# Patient Record
Sex: Female | Born: 1985 | Smoking: Never smoker
Health system: Southern US, Community
[De-identification: ages and names within clinical notes are randomized; demographics above are authoritative.]

## PROBLEM LIST (undated history)

## (undated) DIAGNOSIS — N809 Endometriosis, unspecified: Secondary | ICD-10-CM

## (undated) DIAGNOSIS — K219 Gastro-esophageal reflux disease without esophagitis: Secondary | ICD-10-CM

## (undated) DIAGNOSIS — R51 Headache: Secondary | ICD-10-CM

## (undated) DIAGNOSIS — K567 Ileus, unspecified: Secondary | ICD-10-CM

## (undated) DIAGNOSIS — D649 Anemia, unspecified: Secondary | ICD-10-CM

## (undated) DIAGNOSIS — K9189 Other postprocedural complications and disorders of digestive system: Secondary | ICD-10-CM

## (undated) DIAGNOSIS — R519 Headache, unspecified: Secondary | ICD-10-CM

## (undated) HISTORY — PX: ABDOMINAL HYSTERECTOMY: SHX81

## (undated) HISTORY — PX: TONSILLECTOMY: SUR1361

## (undated) HISTORY — PX: OVARIAN CYST REMOVAL: SHX89

---

## 2007-07-11 HISTORY — PX: WISDOM TOOTH EXTRACTION: SHX21

## 2007-07-11 HISTORY — PX: COLONOSCOPY: SHX174

## 2012-07-10 HISTORY — PX: DIAGNOSTIC LAPAROSCOPY: SUR761

## 2013-07-10 HISTORY — PX: APPENDECTOMY: SHX54

## 2017-04-11 NOTE — H&P (Signed)
Patient ID: Rachel Stafford is a 31 y.o. female presenting with Pre Op Consulting  on 03/22/2017  HPI: Hx of pelvic pain with surgically dx endometriosis managed 3 yrs ago at an endometriosis center in [place], and Mirena IUD placed 09/2012. She's currently on continuous OCPs. She is due for IUD removal but her strings are not visible, and placement was so painful that she will need anesthesia for removal under ultrasound guidance.  Strings lost last year, and an u/s at [facility] confirmed intrauterine placement 04/2016.  FINDINGS:  Uterus/cervix:Uterus is anteverted and measures 6.4 x 5.3 x 2.6 cm (previously 7.2 x 2.4 x 4.7 cm).No focal myometrial masses are seen. IUD is seen within the endometrium in appropriate position. The IUD strings are located within the endometrial canal per the sonographer's report.   Right ovary: Measures 4.1 x 1.5 x 1.7 cm (previously 3.0 x 1.7 x 2.0 cm) and appears unremarkable. Normal vascularity.  Left ovary: Measures 4.0 x 3.2 x 2.9 cm (previously 2.3 x 1.6 x 2.8 cm). There is a left ovarian cyst measuring 3.0 x 2.8 x 2.3 cm. Normal vascularity.  Cul de sac: No free fluid.  Her pap is up to date.   She is not having pelvic pain that is bad and isn't interested in op lap at this time.  Past Medical History:  has a past medical history of Allergic state; Endometriosis of uterus; Fibroid; GERD (gastroesophageal reflux disease); Ovarian cyst; and Vaginismus.  Past Surgical History:  has a past surgical history that includes Appendectomy; Tonsillectomy; Removal Ovarian Cyst; and Pelvic laparoscopy. Family History: family history includes Allergic rhinitis in her father; Other in her maternal grandmother and mother; Prostate cancer in her paternal grandfather; Skin cancer in her maternal grandmother, mother, and paternal grandmother; Stroke in her paternal grandfather. Social History:  reports that she has never smoked. She has never used smokeless  tobacco. She reports that she drinks alcohol. She reports that she does not use drugs. OB/GYN History:          OB History    Gravida Para Term Preterm AB Living   0 0 0 0 0 0   SAB TAB Ectopic Molar Multiple Live Births   0 0 0 0 0 0      Allergies: is allergic to oxycodone-acetaminophen. Medications:  Current Outpatient Prescriptions:  .  cetirizine (ZYRTEC) 10 MG tablet, Take 10 mg by mouth., Disp: , Rfl:  .  ciclopirox (PENLAC) 8 % topical nail solution, Apply over nail and surrounding skin., Disp: 6.6 mL, Rfl: 11 .  clindamycin (CLEOCIN T) 1 % lotion, Apply 2x daily, Disp: 60 mL, Rfl: 2 .  levonorgestrel (MIRENA) 20 mcg/24 hr (5 years) IUD, Insert into the uterus., Disp: , Rfl:  .  naproxen (NAPROSYN) 500 MG tablet, Take 1 tablet (500 mg total) by mouth 2 (two) times daily with meals., Disp: 20 tablet, Rfl: 1 .  norethindrone-ethinyl estradiol-iron (LOESTRIN 24 FE) 1 mg-20 mcg (24)/75 mg (4) tablet, Take 1 tablet by mouth once daily., Disp: 3 Package, Rfl: 4   Review of Systems: No SOB, no palpitations or chest pain, no new lower extremity edema, no nausea or vomiting or bowel or bladder complaints. See HPI for gyn specific ROS.   Exam:   BP 122/79   Pulse 64   Wt 62.6 kg (138 lb)   BMI 23.69 kg/m   General: Patient is well-groomed, well-nourished, appears stated age in no acute distress  HEENT: head is atraumatic and normocephalic, trachea  is midline, neck is supple with no palpable nodules  CV: Regular rhythm and normal heart rate, no murmur  Pulm: Clear to auscultation throughout lung fields with no wheezing, crackles, or rhonchi. No increased work of breathing  Abdomen: soft , no mass, non-tender, no rebound tenderness, no hepatomegaly  Pelvic: deferred  Impression:   The primary encounter diagnosis was Pelvic pain in female. A diagnosis of Intrauterine contraceptive device threads lost, subsequent encounter was also pertinent to this  visit.    Plan:   IUD removal in the OR under u/s guidance. Hysteroscopy is not expected.  We discussed dx lap but she is not interested at this time.   Consents signed today. Risks of surgery were discussed with the patient including but not limited to: bleeding which may require transfusion; infection which may require antibiotics; injury to uterus or surrounding organs; intrauterine scarring which may impair future fertility; need for additional procedures including laparotomy or laparoscopy; and other postoperative/anesthesia complications. Written informed consent was obtained.

## 2017-04-13 ENCOUNTER — Encounter: Payer: Self-pay | Admitting: *Deleted

## 2017-04-13 ENCOUNTER — Encounter
Admission: RE | Admit: 2017-04-13 | Discharge: 2017-04-13 | Disposition: A | Discharge status: Home / Self Care | Payer: Managed Care, Other (non HMO) | Source: Ambulatory Visit | Attending: Obstetrics and Gynecology | Admitting: Obstetrics and Gynecology

## 2017-04-13 NOTE — Patient Instructions (Signed)
  Your procedure is scheduled on: 04-20-17 FRIDAY Report to Same Day Surgery 2nd floor medical mall [facility] Entrance-take elevator on left to 2nd floor.  Check in with surgery information desk.) To find out your arrival time please call [phone] between 1PM - 3PM on 04-19-17 THURSDAY  Remember: Instructions that are not followed completely may result in serious medical risk, up to and including death, or upon the discretion of your surgeon and anesthesiologist your surgery may need to be rescheduled.    _x___ 1. Do not eat food after midnight the night before your procedure. NO GUM CHEWING OR HARD CANDIES.  You may drink clear liquids up to 2 hours before you are scheduled to arrive at the hospital for your procedure.  Do not drink clear liquids within 2 hours of your scheduled arrival to the hospital.  Clear liquids include  --Water or Apple juice without pulp  --Clear carbohydrate beverage such as ClearFast or Gatorade  --Black Coffee or Clear Tea (No milk, no creamers, do not add anything to the coffee or Tea)  Type 1 and type 2 diabetics should only drink water.     __x__ 2. No Alcohol for 24 hours before or after surgery.   __x__3. No Smoking for 24 prior to surgery.   ____  4. Bring all medications with you on the day of surgery if instructed.    __x__ 5. Notify your doctor if there is any change in your medical condition     (cold, fever, infections).     Do not wear jewelry, make-up, hairpins, clips or nail polish.  Do not wear lotions, powders, or perfumes. You may wear deodorant.  Do not shave 48 hours prior to surgery. Men may shave face and neck.  Do not bring valuables to the hospital.    [facility] is not responsible for any belongings or valuables.               Contacts, dentures or bridgework may not be worn into surgery.  Leave your suitcase in the car. After surgery it may be brought to your room.  For patients admitted to the hospital, discharge time  is determined by your treatment team.   Patients discharged the day of surgery will not be allowed to drive home.  You will need someone to drive you home and stay with you the night of your procedure.    Please read over the following fact sheets that you were given:    ____ TAKE THE FOLLOWING MEDICATIONS THE MORNING OF SURGERY WITH A SMALL SIP OF WATER. These include:  1. NONE  2.  3.  4.  5.  6.  ____Fleets enema or Magnesium Citrate as directed.   ____ Use CHG Soap or sage wipes as directed on instruction sheet   ____ Use inhalers on the day of surgery and bring to hospital day of surgery  ____ Stop Metformin and Janumet 2 days prior to surgery.    ____ Take 1/2 of usual insulin dose the night before surgery and none on the morning surgery.   ____ Follow recommendations from Cardiologist, Pulmonologist or PCP regarding stopping Aspirin, Coumadin, Plavix ,Eliquis, Effient, or Pradaxa, and Pletal.  X____Stop Anti-inflammatories such as Advil, ALEVE, Ibuprofen, Motrin, Naproxen, Naprosyn, Goodies powders or aspirin products NOW-OK to take Tylenol .   ____ Stop supplements until after surgery.   ____ Bring C-Pap to the hospital.

## 2017-04-17 ENCOUNTER — Encounter
Admission: RE | Admit: 2017-04-17 | Discharge: 2017-04-17 | Disposition: A | Discharge status: Home / Self Care | Payer: Managed Care, Other (non HMO) | Source: Ambulatory Visit | Attending: Obstetrics and Gynecology | Admitting: Obstetrics and Gynecology

## 2017-04-17 DIAGNOSIS — Z8739 Personal history of other diseases of the musculoskeletal system and connective tissue: Secondary | ICD-10-CM | POA: Diagnosis not present

## 2017-04-17 DIAGNOSIS — Z791 Long term (current) use of non-steroidal anti-inflammatories (NSAID): Secondary | ICD-10-CM | POA: Diagnosis not present

## 2017-04-17 DIAGNOSIS — Y838 Other surgical procedures as the cause of abnormal reaction of the patient, or of later complication, without mention of misadventure at the time of the procedure: Secondary | ICD-10-CM | POA: Diagnosis not present

## 2017-04-17 DIAGNOSIS — Z793 Long term (current) use of hormonal contraceptives: Secondary | ICD-10-CM | POA: Diagnosis not present

## 2017-04-17 DIAGNOSIS — T8332XA Displacement of intrauterine contraceptive device, initial encounter: Secondary | ICD-10-CM | POA: Diagnosis present

## 2017-04-17 DIAGNOSIS — Z79899 Other long term (current) drug therapy: Secondary | ICD-10-CM | POA: Diagnosis not present

## 2017-04-17 LAB — CBC
HCT: 41.7 % (ref 35.0–47.0)
Hemoglobin: 14.3 g/dL (ref 12.0–16.0)
MCH: 30.7 pg (ref 26.0–34.0)
MCHC: 34.2 g/dL (ref 32.0–36.0)
MCV: 89.6 fL (ref 80.0–100.0)
PLATELETS: 241 10*3/uL (ref 150–440)
RBC: 4.65 MIL/uL (ref 3.80–5.20)
RDW: 13.4 % (ref 11.5–14.5)
WBC: 5.1 10*3/uL (ref 3.6–11.0)

## 2017-04-17 LAB — TYPE AND SCREEN
ABO/RH(D): AB NEG
ANTIBODY SCREEN: NEGATIVE

## 2017-04-17 LAB — BASIC METABOLIC PANEL
Anion gap: 6 (ref 5–15)
BUN: 14 mg/dL (ref 6–20)
CHLORIDE: 105 mmol/L (ref 101–111)
CO2: 27 mmol/L (ref 22–32)
CREATININE: 0.84 mg/dL (ref 0.44–1.00)
Calcium: 9.1 mg/dL (ref 8.9–10.3)
Glucose, Bld: 96 mg/dL (ref 65–99)
Potassium: 3.9 mmol/L (ref 3.5–5.1)
SODIUM: 138 mmol/L (ref 135–145)

## 2017-04-20 ENCOUNTER — Encounter
Admission: RE | Disposition: A | Discharge status: Home / Self Care | Payer: Self-pay | Source: Ambulatory Visit | Attending: Obstetrics and Gynecology

## 2017-04-20 ENCOUNTER — Ambulatory Visit
Admission: RE | Admit: 2017-04-20 | Discharge: 2017-04-20 | Disposition: A | Discharge status: Home / Self Care | Payer: Managed Care, Other (non HMO) | Source: Ambulatory Visit | Attending: Obstetrics and Gynecology | Admitting: Obstetrics and Gynecology

## 2017-04-20 ENCOUNTER — Ambulatory Visit: Payer: Managed Care, Other (non HMO) | Admitting: Registered Nurse

## 2017-04-20 DIAGNOSIS — Z793 Long term (current) use of hormonal contraceptives: Secondary | ICD-10-CM | POA: Insufficient documentation

## 2017-04-20 DIAGNOSIS — T8332XA Displacement of intrauterine contraceptive device, initial encounter: Secondary | ICD-10-CM | POA: Insufficient documentation

## 2017-04-20 DIAGNOSIS — Z79899 Other long term (current) drug therapy: Secondary | ICD-10-CM | POA: Insufficient documentation

## 2017-04-20 DIAGNOSIS — Z791 Long term (current) use of non-steroidal anti-inflammatories (NSAID): Secondary | ICD-10-CM | POA: Insufficient documentation

## 2017-04-20 DIAGNOSIS — Z8739 Personal history of other diseases of the musculoskeletal system and connective tissue: Secondary | ICD-10-CM | POA: Insufficient documentation

## 2017-04-20 DIAGNOSIS — Y838 Other surgical procedures as the cause of abnormal reaction of the patient, or of later complication, without mention of misadventure at the time of the procedure: Secondary | ICD-10-CM | POA: Insufficient documentation

## 2017-04-20 HISTORY — DX: Ileus, unspecified: K56.7

## 2017-04-20 HISTORY — DX: Anemia, unspecified: D64.9

## 2017-04-20 HISTORY — DX: Headache: R51

## 2017-04-20 HISTORY — DX: Endometriosis, unspecified: N80.9

## 2017-04-20 HISTORY — PX: IUD REMOVAL: SHX5392

## 2017-04-20 HISTORY — DX: Gastro-esophageal reflux disease without esophagitis: K21.9

## 2017-04-20 HISTORY — DX: Headache, unspecified: R51.9

## 2017-04-20 HISTORY — DX: Other postprocedural complications and disorders of digestive system: K91.89

## 2017-04-20 LAB — ABO/RH: ABO/RH(D): AB NEG

## 2017-04-20 LAB — POCT PREGNANCY, URINE: Preg Test, Ur: NEGATIVE

## 2017-04-20 SURGERY — REMOVAL, INTRAUTERINE DEVICE
Anesthesia: General

## 2017-04-20 MED ORDER — GABAPENTIN 400 MG PO CAPS
800.0000 mg | ORAL_CAPSULE | ORAL | Status: AC
  Administered 2017-04-20: 800 mg via ORAL

## 2017-04-20 MED ORDER — MIDAZOLAM HCL 2 MG/2ML IJ SOLN
INTRAMUSCULAR | Status: DC | PRN
  Administered 2017-04-20: 2 mg via INTRAVENOUS

## 2017-04-20 MED ORDER — DEXAMETHASONE SODIUM PHOSPHATE 10 MG/ML IJ SOLN
INTRAMUSCULAR | Status: DC | PRN
  Administered 2017-04-20: 10 mg via INTRAVENOUS

## 2017-04-20 MED ORDER — GABAPENTIN 300 MG PO CAPS: 900.0000 mg | ORAL_CAPSULE | ORAL | Status: DC

## 2017-04-20 MED ORDER — SILVER NITRATE-POT NITRATE 75-25 % EX MISC
CUTANEOUS | Status: DC | PRN
  Administered 2017-04-20: 6

## 2017-04-20 MED ORDER — ACETAMINOPHEN 500 MG PO TABS
1000.0000 mg | ORAL_TABLET | ORAL | Status: AC
  Administered 2017-04-20: 1000 mg via ORAL

## 2017-04-20 MED ORDER — MIDAZOLAM HCL 2 MG/2ML IJ SOLN
INTRAMUSCULAR | Status: AC
  Filled 2017-04-20: qty 2

## 2017-04-20 MED ORDER — BUPIVACAINE LIPOSOME 1.3 % IJ SUSP
INTRAMUSCULAR | Status: DC | PRN
  Administered 2017-04-20: 20 mL

## 2017-04-20 MED ORDER — IBUPROFEN 800 MG PO TABS: 800.0000 mg | ORAL_TABLET | Freq: Three times a day (TID) | ORAL | 1 refills | Status: AC | PRN

## 2017-04-20 MED ORDER — FAMOTIDINE 20 MG PO TABS
ORAL_TABLET | ORAL | Status: AC
  Filled 2017-04-20: qty 1

## 2017-04-20 MED ORDER — LACTATED RINGERS IV SOLN
INTRAVENOUS | Status: DC
  Administered 2017-04-20: 12:00:00 via INTRAVENOUS

## 2017-04-20 MED ORDER — FENTANYL CITRATE (PF) 100 MCG/2ML IJ SOLN
INTRAMUSCULAR | Status: DC | PRN
  Administered 2017-04-20 (×2): 25 ug via INTRAVENOUS
  Administered 2017-04-20: 50 ug via INTRAVENOUS
  Administered 2017-04-20: 25 ug via INTRAVENOUS

## 2017-04-20 MED ORDER — ONDANSETRON HCL 4 MG/2ML IJ SOLN
INTRAMUSCULAR | Status: AC
  Filled 2017-04-20: qty 2

## 2017-04-20 MED ORDER — LIDOCAINE HCL (PF) 2 % IJ SOLN
INTRAMUSCULAR | Status: AC
  Filled 2017-04-20: qty 4

## 2017-04-20 MED ORDER — GLYCOPYRROLATE 0.2 MG/ML IJ SOLN
INTRAMUSCULAR | Status: AC
  Filled 2017-04-20: qty 1

## 2017-04-20 MED ORDER — PROPOFOL 10 MG/ML IV BOLUS
INTRAVENOUS | Status: DC | PRN
  Administered 2017-04-20: 150 mg via INTRAVENOUS

## 2017-04-20 MED ORDER — BUPIVACAINE LIPOSOME 1.3 % IJ SUSP
INTRAMUSCULAR | Status: AC
  Filled 2017-04-20: qty 20

## 2017-04-20 MED ORDER — LIDOCAINE HCL (CARDIAC) 20 MG/ML IV SOLN
INTRAVENOUS | Status: DC | PRN
  Administered 2017-04-20: 80 mg via INTRAVENOUS

## 2017-04-20 MED ORDER — GABAPENTIN 800 MG PO TABS: 800.0000 mg | ORAL_TABLET | Freq: Every day | ORAL | 0 refills | Status: DC

## 2017-04-20 MED ORDER — PHENYLEPHRINE HCL 10 MG/ML IJ SOLN
INTRAMUSCULAR | Status: DC | PRN
  Administered 2017-04-20 (×3): 100 ug via INTRAVENOUS

## 2017-04-20 MED ORDER — FAMOTIDINE 20 MG PO TABS
20.0000 mg | ORAL_TABLET | Freq: Once | ORAL | Status: AC
  Administered 2017-04-20: 20 mg via ORAL

## 2017-04-20 MED ORDER — EPHEDRINE SULFATE 50 MG/ML IJ SOLN
INTRAMUSCULAR | Status: DC | PRN
  Administered 2017-04-20: 10 mg via INTRAVENOUS

## 2017-04-20 MED ORDER — GABAPENTIN 400 MG PO CAPS
ORAL_CAPSULE | ORAL | Status: AC
  Administered 2017-04-20: 800 mg via ORAL
  Filled 2017-04-20: qty 1

## 2017-04-20 MED ORDER — FENTANYL CITRATE (PF) 100 MCG/2ML IJ SOLN
INTRAMUSCULAR | Status: AC
  Filled 2017-04-20: qty 2

## 2017-04-20 MED ORDER — GLYCOPYRROLATE 0.2 MG/ML IJ SOLN
INTRAMUSCULAR | Status: DC | PRN
  Administered 2017-04-20: 0.2 mg via INTRAVENOUS

## 2017-04-20 MED ORDER — KETOROLAC TROMETHAMINE 30 MG/ML IJ SOLN
INTRAMUSCULAR | Status: AC
  Filled 2017-04-20: qty 1

## 2017-04-20 MED ORDER — KETOROLAC TROMETHAMINE 30 MG/ML IJ SOLN
INTRAMUSCULAR | Status: DC | PRN
  Administered 2017-04-20: 30 mg via INTRAVENOUS

## 2017-04-20 MED ORDER — DEXAMETHASONE SODIUM PHOSPHATE 10 MG/ML IJ SOLN
INTRAMUSCULAR | Status: AC
  Filled 2017-04-20: qty 1

## 2017-04-20 MED ORDER — ACETAMINOPHEN 500 MG PO TABS: 1000.0000 mg | ORAL_TABLET | Freq: Four times a day (QID) | ORAL | 0 refills | Status: AC

## 2017-04-20 MED ORDER — ACETAMINOPHEN 500 MG PO TABS
ORAL_TABLET | ORAL | Status: AC
  Administered 2017-04-20: 1000 mg via ORAL
  Filled 2017-04-20: qty 2

## 2017-04-20 SURGICAL SUPPLY — 9 items
CATH ROBINSON RED A/P 16FR (CATHETERS) ×2 IMPLANT
GLOVE BIO SURGEON STRL SZ7 (GLOVE) ×6 IMPLANT
GLOVE INDICATOR 7.5 STRL GRN (GLOVE) ×6 IMPLANT
GOWN STRL REUS W/ TWL LRG LVL3 (GOWN DISPOSABLE) ×2 IMPLANT
GOWN STRL REUS W/TWL LRG LVL3 (GOWN DISPOSABLE) ×2
KIT RM TURNOVER CYSTO AR (KITS) ×2 IMPLANT
PACK DNC HYST (MISCELLANEOUS) ×2 IMPLANT
PAD PREP 24X41 OB/GYN DISP (PERSONAL CARE ITEMS) ×2 IMPLANT
TOWEL OR 17X26 4PK STRL BLUE (TOWEL DISPOSABLE) ×2 IMPLANT

## 2017-04-20 NOTE — Anesthesia Postprocedure Evaluation (Signed)
Anesthesia Post Note  Patient: Rachel Stafford  Procedure(s) Performed: INTRAUTERINE DEVICE (IUD) REMOVAL (N/A )  Patient location during evaluation: PACU Anesthesia Type: General Level of consciousness: awake and alert Pain management: pain level controlled Vital Signs Assessment: post-procedure vital signs reviewed and stable Respiratory status: spontaneous breathing, nonlabored ventilation, respiratory function stable and patient connected to nasal cannula oxygen Cardiovascular status: blood pressure returned to baseline and stable Postop Assessment: no apparent nausea or vomiting Anesthetic complications: no     Last Vitals:  Vitals:   04/20/17 1413 04/20/17 1434  BP: 122/87 (!) 124/95  Pulse: 80 84  Resp: 16   Temp: (!) 35.9 C   SpO2: 100% 100%    Last Pain:  Vitals:   04/20/17 1434  TempSrc:   PainSc: 2                  Anab Vivar S

## 2017-04-20 NOTE — Discharge Instructions (Signed)
Discharge instructions after uterine surgery  Signs and Symptoms to Report  Call our office at [phone] if you have any of the following:    Fever over 100.4 degrees or higher  Severe stomach pain not relieved with pain medications  Bright red bleeding thats heavier than a period that does not slow with rest after the first 24 hours  To go the bathroom a lot (frequency), you cant hold your urine (urgency), or it hurts when you empty your bladder (urinate)  Chest pain  Shortness of breath  Pain in the calves of your legs  Severe nausea and vomiting not relieved with anti-nausea medications  Any concerns  What You Can Expect after Surgery  You may see some pink tinged, bloody fluid. This is normal. You may also have cramping for several days.   Activities after Your Discharge Follow these guidelines to help speed your recovery at home:  Dont drive if you are in pain or taking narcotic pain medicine. You may drive when you can safely slam on the brakes, turn the wheel forcefully, and rotate your torso comfortably. This is typically 4-7 days. Practice in a parking lot or side street prior to attempting to drive regularly.   Ask others to help with household chores for 4 weeks.  Dont do strenuous activities, exercises, or sports like vacuuming, tennis, squash, etc. until your doctor says it is safe to do so.  Walk as you feel able. Rest often since it may take a week or two for your energy level to return to normal.   You may climb stairs  Avoid constipation:   -Eat fruits, vegetables, and whole grains. Eat small meals as your appetite will take time to return to normal.   -Drink 6 to 8 glasses of water each day unless your doctor has told you to limit your fluids.   -Use a laxative or stool softener as needed if constipation becomes a problem. You may take Miralax, metamucil, Citrucil, Colace, Senekot, FiberCon, etc. If this does not relieve the constipation, try  two tablespoons of Milk Of Magnesia every 8 hours until your bowels move.   You may shower.   Do not get in a hot tub, swimming pool, etc. until your doctor agrees.  Do not douche, use tampons, or have sex until your doctor says it is okay, usually about 2 weeks.  Take your pain medicine when you need it. The medicine may not work as well if the pain is bad.  Take the medicines you were taking before surgery. Other medications you might need are pain medications (ibuprofen, tylenol, gabapentin).

## 2017-04-20 NOTE — Interval H&P Note (Signed)
History and Physical Interval Note:  04/20/2017 11:44 AM  Rachel Stafford  has presented today for surgery, with the diagnosis of Lost IUD Strings  The various methods of treatment have been discussed with the patient and family. After consideration of risks, benefits and other options for treatment, the patient has consented to  Procedure(s): INTRAUTERINE DEVICE (IUD) REMOVAL (N/A) and chronic pelvic pain as a surgical intervention .  The patient's history has been reviewed, patient examined, no change in status, stable for surgery.  I have reviewed the patient's chart and labs.  Questions were answered to the patient's satisfaction.     [name]

## 2017-04-20 NOTE — Anesthesia Procedure Notes (Signed)
Procedure Name: LMA Insertion Date/Time: 04/20/2017 12:11 PM Performed by: Stormy Fabian Pre-anesthesia Checklist: Patient identified, Patient being monitored, Timeout performed, Emergency Drugs available and Suction available Patient Re-evaluated:Patient Re-evaluated prior to induction Oxygen Delivery Method: Circle system utilized Preoxygenation: Pre-oxygenation with 100% oxygen Induction Type: IV induction Ventilation: Mask ventilation without difficulty LMA: LMA inserted LMA Size: 3.5 Tube type: Oral Number of attempts: 1 Placement Confirmation: positive ETCO2 and breath sounds checked- equal and bilateral Tube secured with: Tape Dental Injury: Teeth and Oropharynx as per pre-operative assessment

## 2017-04-20 NOTE — Transfer of Care (Signed)
Immediate Anesthesia Transfer of Care Note  Patient: Rachel Stafford  Procedure(s) Performed: Procedure(s): INTRAUTERINE DEVICE (IUD) REMOVAL (N/A)  Patient Location: PACU  Anesthesia Type:General  Level of Consciousness: sedated  Airway & Oxygen Therapy: Patient Spontanous Breathing and Patient connected to face mask oxygen  Post-op Assessment: Report given to RN and Post -op Vital signs reviewed and stable  Post vital signs: Reviewed and stable  Last Vitals:  Vitals:   04/20/17 1124 04/20/17 1317  BP: 126/78 121/86  Pulse: 64 (!) 106  Resp: 20 16  Temp: 36.5 C 36.4 C  SpO2: 100% 100%    Complications: No apparent anesthesia complications

## 2017-04-20 NOTE — Op Note (Signed)
PROCEDURE DATE: 07/16/2014  PREOPERATIVE DIAGNOSES: Lost IUD strings, hx of chronic debilitating pelvic pain POSTOPERATIVE DIAGNOSES: The same PROCEDURE:  IUD Removal under anesthesia, ultrasound guidance SURGEON: Dr. [name] ANESTHESIOLOGIST: Berdine Addison, MD Anesthesiologist: Berdine Addison, MD CRNA: Stormy Fabian, CRNA   INDICATIONS: 31 y.o. F here for IUD removal under anesthesia secondary to lost IUD strings and significant pelvic pain. The risks of surgery were discussed in detail with the patient including but not limited to: bleeding; infection which may require antibiotics; injury to uterus or surrounding organs which may involve bowel, bladder, ureters ; need for additional procedures including laparoscopy or laparotomy; thromboembolic phenomenon, surgical site problems and other postoperative/anesthesia complications. Written informed consent was obtained.   FINDINGS: Small uterus, nulliparous cervix. IUD strings not visualized but ultrasound clearly confirmed fundal placement.  ANESTHESIA: LMA, local using 20 ml of 0.5% Exparel ESTIMATED BLOOD LOSS: 5 ml COMPLICATIONS: None immediate  PROCEDURE IN DETAIL: The patient was then taken to the operating room where LMA was administered and was found to be adequate. She was placed in the dorsal lithotomy position, and was prepped and draped in a sterile manner. After an adequate timeout was performed, attention was turned to her pelvis where a speculum was placed in the vagina.  The cervix was visualized and grasped anteriorly with a single tooth tenaculum. Ultrasound was used to confirm IUD placement.   Dilation was not required. An IUD hook was introduced into the endometrial cavity.  After a few attempts, the IUD was grasped and removed in its entirety.  20cc of Exparel was used to place a cervical block.   All instruments were removed from the patient's vagina. Silver nitrate and pressure used to assure hemostasis.  Sponge and instrument counts were correct.  The patient tolerated the procedure well and was taken to the recovery area awake, and in stable condition.   The patient will be discharged to home as per PACU criteria.  She was prescribed Ibuprofen, Tylenol, Gabapentin for pain. Routine postoperative instructions given.  She will follow up in clinic as needed.She will use OCPs for contraception.

## 2017-04-20 NOTE — Anesthesia Preprocedure Evaluation (Signed)
Anesthesia Evaluation  Patient identified by MRN, date of birth, ID band Patient awake    Reviewed: Allergy & Precautions, NPO status , Patient's Chart, lab work & pertinent test results  History of Anesthesia Complications Negative for: history of anesthetic complications  Airway Mallampati: II       Dental   Pulmonary neg sleep apnea, neg COPD,           Cardiovascular (-) hypertension(-) Past MI and (-) CHF (-) dysrhythmias (-) Valvular Problems/Murmurs     Neuro/Psych neg Seizures    GI/Hepatic Neg liver ROS, GERD  ,  Endo/Other  neg diabetes  Renal/GU negative Renal ROS     Musculoskeletal   Abdominal   Peds  Hematology   Anesthesia Other Findings   Reproductive/Obstetrics                             Anesthesia Physical Anesthesia Plan  ASA: II  Anesthesia Plan: General   Post-op Pain Management:    Induction: Intravenous  PONV Risk Score and Plan: 3 and Ondansetron, Dexamethasone, Midazolam and Treatment may vary due to age or medical condition  Airway Management Planned: Oral ETT  Additional Equipment:   Intra-op Plan:   Post-operative Plan:   Informed Consent: I have reviewed the patients History and Physical, chart, labs and discussed the procedure including the risks, benefits and alternatives for the proposed anesthesia with the patient or authorized representative who has indicated his/her understanding and acceptance.     Plan Discussed with:   Anesthesia Plan Comments:         Anesthesia Quick Evaluation

## 2017-04-20 NOTE — Anesthesia Post-op Follow-up Note (Signed)
Anesthesia QCDR form completed.        

## 2017-04-23 ENCOUNTER — Encounter: Payer: Self-pay | Admitting: Obstetrics and Gynecology

## 2017-04-29 ENCOUNTER — Encounter: Payer: Self-pay | Admitting: Obstetrics and Gynecology

## 2018-01-20 ENCOUNTER — Ambulatory Visit
Admission: EM | Admit: 2018-01-20 | Discharge: 2018-01-20 | Disposition: A | Discharge status: Home / Self Care | Payer: Managed Care, Other (non HMO) | Attending: Emergency Medicine | Admitting: Emergency Medicine

## 2018-01-20 DIAGNOSIS — N938 Other specified abnormal uterine and vaginal bleeding: Secondary | ICD-10-CM | POA: Diagnosis not present

## 2018-01-20 DIAGNOSIS — K219 Gastro-esophageal reflux disease without esophagitis: Secondary | ICD-10-CM | POA: Diagnosis not present

## 2018-01-20 DIAGNOSIS — Z3202 Encounter for pregnancy test, result negative: Secondary | ICD-10-CM | POA: Diagnosis not present

## 2018-01-20 DIAGNOSIS — R42 Dizziness and giddiness: Secondary | ICD-10-CM

## 2018-01-20 DIAGNOSIS — R110 Nausea: Secondary | ICD-10-CM | POA: Diagnosis not present

## 2018-01-20 LAB — CBC WITH DIFFERENTIAL/PLATELET
BASOS ABS: 0 10*3/uL (ref 0–0.1)
Basophils Relative: 1 %
Eosinophils Absolute: 0 10*3/uL (ref 0–0.7)
Eosinophils Relative: 2 %
HEMATOCRIT: 44.2 % (ref 35.0–47.0)
HEMOGLOBIN: 14.9 g/dL (ref 12.0–16.0)
Lymphocytes Relative: 31 %
Lymphs Abs: 1 10*3/uL (ref 1.0–3.6)
MCH: 30.4 pg (ref 26.0–34.0)
MCHC: 33.8 g/dL (ref 32.0–36.0)
MCV: 89.8 fL (ref 80.0–100.0)
MONO ABS: 0.3 10*3/uL (ref 0.2–0.9)
MONOS PCT: 11 %
Neutro Abs: 1.8 10*3/uL (ref 1.4–6.5)
Neutrophils Relative %: 55 %
Platelets: 204 10*3/uL (ref 150–440)
RBC: 4.92 MIL/uL (ref 3.80–5.20)
RDW: 13.1 % (ref 11.5–14.5)
WBC: 3.2 10*3/uL — ABNORMAL LOW (ref 3.6–11.0)

## 2018-01-20 LAB — BASIC METABOLIC PANEL
Anion gap: 11 (ref 5–15)
BUN: 10 mg/dL (ref 6–20)
CALCIUM: 8.4 mg/dL — AB (ref 8.9–10.3)
CO2: 24 mmol/L (ref 22–32)
CREATININE: 0.78 mg/dL (ref 0.44–1.00)
Chloride: 102 mmol/L (ref 98–111)
GFR calc Af Amer: >60 mL/min (ref >60)
GFR calc non Af Amer: >60 mL/min (ref >60)
GLUCOSE: 96 mg/dL (ref 70–99)
Potassium: 3.8 mmol/L (ref 3.5–5.1)
Sodium: 137 mmol/L (ref 135–145)

## 2018-01-20 LAB — URINALYSIS, COMPLETE (UACMP) WITH MICROSCOPIC
Bacteria, UA: NONE SEEN
Bilirubin Urine: NEGATIVE
Glucose, UA: NEGATIVE mg/dL
Ketones, ur: NEGATIVE mg/dL
LEUKOCYTES UA: NEGATIVE
NITRITE: NEGATIVE
PROTEIN: NEGATIVE mg/dL
SQUAMOUS EPITHELIAL / LPF: NONE SEEN (ref 0–5)
Specific Gravity, Urine: 1.01 (ref 1.005–1.030)
WBC UA: NONE SEEN WBC/hpf (ref 0–5)
pH: 6.5 (ref 5.0–8.0)

## 2018-01-20 LAB — PREGNANCY, URINE: Preg Test, Ur: NEGATIVE

## 2018-01-20 MED ORDER — PANTOPRAZOLE SODIUM 20 MG PO TBEC: 20.0000 mg | DELAYED_RELEASE_TABLET | Freq: Every day | ORAL | 1 refills | Status: DC

## 2018-01-20 NOTE — ED Provider Notes (Signed)
[facility]    CSN: [id number] Arrival date & time: 01/20/18  1215     History   Chief Complaint Chief Complaint  Patient presents with  . Nausea    HPI Rachel Stafford is a 32 y.o. female.   HPI  32 year old female presents with nausea and dizziness that she is had for a month or 2.  No syncope or near near syncope.  Has a history of endometriosis has recently been having periods every 10days.  States that she has fatigue and gets exhausted with only short distance walking.  Her lightheadedness occurs with positional changes and she feels very fatigued following that.  She and her husband are both starting a new job starting tomorrow.  She has had 3 periods this month are the last one started yesterday.  Last 4 to 5 days and has a normal flow for her.  Is going to schedule appointment with an OB/GYN for follow-up.     Past Medical History:  Diagnosis Date  . Anemia   . Endometriosis   . GERD (gastroesophageal reflux disease)    OTC CHEWABLES (LIKE TUMS PER PT)  . Headache   . Ileus, postoperative (HCC)     There are no active problems to display for this patient.   Past Surgical History:  Procedure Laterality Date  . APPENDECTOMY  2015  . COLONOSCOPY  2009  . DIAGNOSTIC LAPAROSCOPY  2014  . IUD REMOVAL N/A 04/20/2017   Procedure: INTRAUTERINE DEVICE (IUD) REMOVAL;  Surgeon: [name], MD;  Location: [facility] ORS;  Service: Gynecology;  Laterality: N/A;  . OVARIAN CYST REMOVAL    . TONSILLECTOMY     as a child  . WISDOM TOOTH EXTRACTION  2009    OB History   None      Home Medications    Prior to Admission medications   Medication Sig Start Date End Date Taking? Authorizing Provider  cetirizine (ZYRTEC) 10 MG tablet Take 10 mg by mouth daily.   Yes [provider]  norethindrone-ethinyl estradiol (JUNEL FE,GILDESS FE,LOESTRIN FE) 1-20 MG-MCG tablet Take 1 tablet by mouth daily.   Yes [provider]  ciclopirox  (PENLAC) 8 % solution Apply 1 application topically daily. 03/08/17   [provider]  Cyanocobalamin (B-12 PO) Take 2 Doses by mouth daily. gummies    [provider]  cyclobenzaprine (FLEXERIL) 5 MG tablet Take 5 mg by mouth daily as needed for muscle spasms.    [provider]  gabapentin (NEURONTIN) 800 MG tablet Take 1 tablet (800 mg total) by mouth at bedtime. 04/20/17 04/23/17  [name], MD  ibuprofen (ADVIL,MOTRIN) 800 MG tablet Take 1 tablet (800 mg total) by mouth every 8 (eight) hours as needed for moderate pain. 04/20/17   [name], MD  pantoprazole (PROTONIX) 20 MG tablet Take 1 tablet (20 mg total) by mouth daily. 01/20/18   Lutricia Feil, PA-C  rizatriptan (MAXALT) 5 MG tablet Take 5 mg by mouth daily as needed for migraine. 10/07/13   [provider]    Family History No family history on file.  Social History Social History   Tobacco Use  . Smoking status: Never Smoker  . Smokeless tobacco: Never Used  Substance Use Topics  . Alcohol use: Yes    Comment: 2-3 beer or wine weekly  . Drug use: No     Allergies   Ondansetron and Percocet [oxycodone-acetaminophen]   Review of Systems Review of Systems  Constitutional: Positive for activity  change and fatigue. Negative for chills and fever.  Genitourinary: Positive for menstrual problem.  Neurological: Positive for light-headedness.  All other systems reviewed and are negative.    Physical Exam Triage Vital Signs ED Triage Vitals  Enc Vitals Group     BP 01/20/18 1250 100/83     Pulse Rate 01/20/18 1250 85     Resp 01/20/18 1250 18     Temp 01/20/18 1250 99.1 F (37.3 C)     Temp Source 01/20/18 1250 Oral     SpO2 01/20/18 1250 98 %     Weight 01/20/18 1252 138 lb (62.6 kg)     Height --      Head Circumference --      Peak Flow --      Pain Score 01/20/18 1252 0     Pain Loc --      Pain Edu? --      Excl. in GC? --    Orthostatic VS for the  past 24 hrs:  BP- Lying Pulse- Lying BP- Sitting Pulse- Sitting BP- Standing at 0 minutes Pulse- Standing at 0 minutes  01/20/18 1539 116/80 56 124/80 60 117/85 66    Updated Vital Signs BP 100/83 (BP Location: Right Arm)   Pulse 85   Temp 99.1 F (37.3 C) (Oral)   Resp 18   Wt 138 lb (62.6 kg)   LMP 01/20/2018 (Exact Date)   SpO2 98%   Visual Acuity Right Eye Distance:   Left Eye Distance:   Bilateral Distance:    Right Eye Near:   Left Eye Near:    Bilateral Near:     Physical Exam  Constitutional: She is oriented to person, place, and time. She appears well-developed and well-nourished. No distress.  HENT:  Head: Normocephalic.  Eyes: Pupils are equal, round, and reactive to light. Right eye exhibits no discharge. Left eye exhibits no discharge.  Neck: Normal range of motion.  Cardiovascular: Normal rate and regular rhythm.  Pulmonary/Chest: Effort normal and breath sounds normal.  Abdominal: Soft. Bowel sounds are normal. She exhibits no distension. There is no tenderness. There is no rebound and no guarding.  Musculoskeletal: Normal range of motion.  Neurological: She is alert and oriented to person, place, and time.  Skin: Skin is warm and dry. She is not diaphoretic.  Psychiatric: She has a normal mood and affect. Her behavior is normal. Judgment and thought content normal.  Nursing note and vitals reviewed.    UC Treatments / Results  Labs (all labs ordered are listed, but only abnormal results are displayed) Labs Reviewed  BASIC METABOLIC PANEL - Abnormal; Notable for the following components:      Result Value   Calcium 8.4 (*)    All other components within normal limits  CBC WITH DIFFERENTIAL/PLATELET - Abnormal; Notable for the following components:   WBC 3.2 (*)    All other components within normal limits  URINALYSIS, COMPLETE (UACMP) WITH MICROSCOPIC - Abnormal; Notable for the following components:   Hgb urine dipstick TRACE (*)    All other  components within normal limits  PREGNANCY, URINE    EKG None  Radiology No results found.  Procedures Procedures (including critical care time)  Medications Ordered in UC Medications - No data to display  Initial Impression / Assessment and Plan / UC Course  I have reviewed the triage vital signs and the nursing notes.  Pertinent labs & imaging results that were available during my care of the patient  were reviewed by me and considered in my medical decision making (see chart for details).     Plan: 1. Test/x-ray results and diagnosis reviewed with patient 2. rx as per orders; risks, benefits, potential side effects reviewed with patient 3. Recommend supportive treatment with use of Protonix for her GERD symptoms.  Her laboratories today are reassuring.  She has a low WBC at 3.2 but do not think that that this is a factor.  Orthostatics today are normal.  I have encouraged her that she should follow-up with her primary care physician next week for further evaluation.  Need to move her legs before she changes positions quickly.  We will also need to follow-up with OB/GYN for her endometriosis. 4. F/u prn if symptoms worsen or don't improve  Final Clinical Impressions(s) / UC Diagnoses   Final diagnoses:  Nausea  Positional lightheadedness  Gastroesophageal reflux disease, esophagitis presence not specified   Discharge Instructions   None    ED Prescriptions    Medication Sig Dispense Auth. Provider   pantoprazole (PROTONIX) 20 MG tablet Take 1 tablet (20 mg total) by mouth daily. 30 tablet Lutricia Feiloemer, Jurni Cesaro P, PA-C     Controlled Substance Prescriptions [place] Controlled Substance Registry consulted? Not Applicable   Lutricia FeilRoemer, Torre Pikus P, PA-C 01/20/18 1714

## 2018-01-20 NOTE — ED Triage Notes (Signed)
Pt here since yesterday having nausea and dizzy for about a month or 2. Does start a new job tomorrow and wanted to get it checked before she started. Does need to have an appt with her ob gyn for endometriosis and has been having periods every 10 days and thinks possibly her iron is low. Does get exhausted just walking short distances.

## 2018-05-21 ENCOUNTER — Ambulatory Visit
Admission: EM | Admit: 2018-05-21 | Discharge: 2018-05-21 | Disposition: A | Discharge status: Home / Self Care | Payer: 59 | Attending: Family Medicine | Admitting: Family Medicine

## 2018-05-21 DIAGNOSIS — S61039A Puncture wound without foreign body of unspecified thumb without damage to nail, initial encounter: Secondary | ICD-10-CM

## 2018-05-21 DIAGNOSIS — W260XXA Contact with knife, initial encounter: Secondary | ICD-10-CM

## 2018-05-21 DIAGNOSIS — T148XXA Other injury of unspecified body region, initial encounter: Secondary | ICD-10-CM

## 2018-05-21 MED ORDER — CEPHALEXIN 500 MG PO CAPS: 500.0000 mg | ORAL_CAPSULE | Freq: Four times a day (QID) | ORAL | 0 refills | Status: AC

## 2018-05-21 NOTE — Discharge Instructions (Signed)
Keep clean.  Antibiotic if needed.  Take care  Dr. Adriana Simasook

## 2018-05-21 NOTE — ED Provider Notes (Signed)
[facility]    CSN: [id number] Arrival date & time: 05/21/18  1844  History   Chief Complaint Chief Complaint  Patient presents with  . Puncture Wound   HPI 32 year old female presents with a puncture wound.  Patient states that she was using a knife that is used for clay.  She was using it to work on a bird house.  She states that she accidentally stabbed her left thumb.  She states that the knife was covered in [place] which worried her.  Patient cleaned the area.  She states that she thought everything was going to be fine but became concerned when the pain began to worsen.  No bleeding currently.  Last tetanus was 2013.  Patient has no other complaints or concerns at this time.   PMH, Surgical Hx, Family Hx, Social History reviewed and updated as below.  Past Medical History:  Diagnosis Date  . Anemia   . Endometriosis   . GERD (gastroesophageal reflux disease)    OTC CHEWABLES (LIKE TUMS PER PT)  . Headache   . Ileus, postoperative [facility])    Past Surgical History:  Procedure Laterality Date  . APPENDECTOMY  2015  . COLONOSCOPY  2009  . DIAGNOSTIC LAPAROSCOPY  2014  . IUD REMOVAL N/A 04/20/2017   Procedure: INTRAUTERINE DEVICE (IUD) REMOVAL;  Surgeon: [name], MD;  Location: [facility] ORS;  Service: Gynecology;  Laterality: N/A;  . OVARIAN CYST REMOVAL    . TONSILLECTOMY     as a child  . WISDOM TOOTH EXTRACTION  2009    OB History   None    Home Medications    Prior to Admission medications   Medication Sig Start Date End Date Taking? Authorizing Provider  cetirizine (ZYRTEC) 10 MG tablet Take 10 mg by mouth daily.   Yes [provider]  pantoprazole (PROTONIX) 20 MG tablet Take 1 tablet (20 mg total) by mouth daily. 01/20/18  Yes Lutricia Feil, PA-C  cephALEXin (KEFLEX) 500 MG capsule Take 1 capsule (500 mg total) by mouth 4 (four) times daily for 5 days. 05/21/18 05/26/18  Tommie Sams, DO  ciclopirox (PENLAC) 8 % solution Apply 1  application topically daily. 03/08/17   [provider]  Cyanocobalamin (B-12 PO) Take 2 Doses by mouth daily. gummies    [provider]  cyclobenzaprine (FLEXERIL) 5 MG tablet Take 5 mg by mouth daily as needed for muscle spasms.    [provider]  gabapentin (NEURONTIN) 800 MG tablet Take 1 tablet (800 mg total) by mouth at bedtime. 04/20/17 04/23/17  [name], MD  ibuprofen (ADVIL,MOTRIN) 800 MG tablet Take 1 tablet (800 mg total) by mouth every 8 (eight) hours as needed for moderate pain. 04/20/17   [name], MD  norethindrone-ethinyl estradiol (JUNEL FE,GILDESS FE,LOESTRIN FE) 1-20 MG-MCG tablet Take 1 tablet by mouth daily.    [provider]  rizatriptan (MAXALT) 5 MG tablet Take 5 mg by mouth daily as needed for migraine. 10/07/13   [provider]   Family History Family History  Problem Relation Age of Onset  . Healthy Mother   . Cancer Mother   . Heart disease Father   . Hyperlipidemia Father     Social History Social History   Tobacco Use  . Smoking status: Never Smoker  . Smokeless tobacco: Never Used  Substance Use Topics  . Alcohol use: Yes    Comment: 2-3 beer or wine weekly  . Drug use: No  Allergies   Ondansetron and Percocet [oxycodone-acetaminophen]   Review of Systems Review of Systems  Constitutional: Negative.   Skin: Positive for wound.   Physical Exam Triage Vital Signs ED Triage Vitals  Enc Vitals Group     BP 05/21/18 1900 118/83     Pulse Rate 05/21/18 1900 66     Resp 05/21/18 1900 18     Temp 05/21/18 1900 98.8 F (37.1 C)     Temp Source 05/21/18 1900 Oral     SpO2 05/21/18 1900 100 %     Weight 05/21/18 1902 148 lb (67.1 kg)     Height --      Head Circumference --      Peak Flow --      Pain Score 05/21/18 1902 0     Pain Loc --      Pain Edu? --      Excl. in GC? --    Updated Vital Signs BP 118/83 (BP Location: Right Arm)   Pulse 66   Temp 98.8 F (37.1 C)  (Oral)   Resp 18   Wt 67.1 kg   LMP 05/07/2018 (Exact Date)   SpO2 100%   Visual Acuity Right Eye Distance:   Left Eye Distance:   Bilateral Distance:    Right Eye Near:   Left Eye Near:    Bilateral Near:     Physical Exam  Constitutional: She is oriented to person, place, and time. She appears well-developed. No distress.  HENT:  Head: Normocephalic and atraumatic.  Pulmonary/Chest: Effort normal. No respiratory distress.  Neurological: She is alert and oriented to person, place, and time.  Skin:  Left thumb with small puncture wound.  No erythema.  No drainage.  Psychiatric: She has a normal mood and affect. Her behavior is normal.  Nursing note and vitals reviewed.  UC Treatments / Results  Labs (all labs ordered are listed, but only abnormal results are displayed) Labs Reviewed - No data to display  EKG None  Radiology No results found.  Procedures Procedures (including critical care time)  Medications Ordered in UC Medications - No data to display  Initial Impression / Assessment and Plan / UC Course  I have reviewed the triage vital signs and the nursing notes.  Pertinent labs & imaging results that were available during my care of the patient were reviewed by me and considered in my medical decision making (see chart for details).    32 year old female presents with a puncture wound.  Discussed watchful waiting versus antibiotic prophylaxis.  Patient elected for the former.  Wait-and-see prescription given in case she worsens.  Final Clinical Impressions(s) / UC Diagnoses   Final diagnoses:  Puncture wound     Discharge Instructions     Keep clean.  Antibiotic if needed.  Take care  Dr. Adriana Simasook    ED Prescriptions    Medication Sig Dispense Auth. Provider   cephALEXin (KEFLEX) 500 MG capsule Take 1 capsule (500 mg total) by mouth 4 (four) times daily for 5 days. 20 capsule Tommie Samsook, Alveda Vanhorne G, DO     Controlled Substance Prescriptions [place]  Controlled Substance Registry consulted? Not Applicable   Tommie SamsCook, Katlin Bortner G, DO 05/21/18 2012

## 2018-05-21 NOTE — ED Triage Notes (Signed)
Pt states she was doing crafts around 3pm today and stabbed her left thumb with a knife that had clay on it and now it's becoming more painful when moving her thumb and wanted to get it checked out. It is covered with a Band-Aid currently.

## 2018-06-17 DIAGNOSIS — N941 Unspecified dyspareunia: Secondary | ICD-10-CM | POA: Diagnosis not present

## 2018-06-17 DIAGNOSIS — R102 Pelvic and perineal pain: Secondary | ICD-10-CM | POA: Diagnosis not present

## 2018-06-18 DIAGNOSIS — N800 Endometriosis of uterus: Secondary | ICD-10-CM | POA: Diagnosis not present

## 2018-06-18 DIAGNOSIS — R102 Pelvic and perineal pain: Secondary | ICD-10-CM | POA: Diagnosis not present

## 2018-06-18 DIAGNOSIS — N83292 Other ovarian cyst, left side: Secondary | ICD-10-CM | POA: Diagnosis not present

## 2018-06-18 DIAGNOSIS — N838 Other noninflammatory disorders of ovary, fallopian tube and broad ligament: Secondary | ICD-10-CM | POA: Diagnosis not present

## 2018-06-18 DIAGNOSIS — N736 Female pelvic peritoneal adhesions (postinfective): Secondary | ICD-10-CM | POA: Diagnosis not present

## 2018-06-18 DIAGNOSIS — N83202 Unspecified ovarian cyst, left side: Secondary | ICD-10-CM | POA: Diagnosis not present

## 2018-06-18 DIAGNOSIS — N803 Endometriosis of pelvic peritoneum: Secondary | ICD-10-CM | POA: Diagnosis not present

## 2018-06-27 ENCOUNTER — Encounter: Payer: Self-pay | Admitting: Emergency Medicine

## 2018-06-27 ENCOUNTER — Other Ambulatory Visit: Payer: Self-pay

## 2018-06-27 ENCOUNTER — Ambulatory Visit
Admission: EM | Admit: 2018-06-27 | Discharge: 2018-06-27 | Disposition: A | Discharge status: Home / Self Care | Payer: 59 | Attending: Family Medicine | Admitting: Family Medicine

## 2018-06-27 DIAGNOSIS — T8149XA Infection following a procedure, other surgical site, initial encounter: Secondary | ICD-10-CM

## 2018-06-27 MED ORDER — DOXYCYCLINE HYCLATE 100 MG PO CAPS: 100.0000 mg | ORAL_CAPSULE | Freq: Two times a day (BID) | ORAL | 0 refills | Status: DC

## 2018-06-27 MED ORDER — MUPIROCIN 2 % EX OINT: 1.0000 "application " | TOPICAL_OINTMENT | Freq: Two times a day (BID) | CUTANEOUS | 0 refills | Status: AC

## 2018-06-27 NOTE — Discharge Instructions (Signed)
Medication as prescribed.  Take care and merry Christmas.  Dr. Adriana Simasook

## 2018-06-27 NOTE — ED Triage Notes (Signed)
Patient in today stating that she had a hysterectomy 06/18/18 and she thinks her incision site is infected. She states it started itching yesterday. Patient used a heating pad to the area and pus came out. Patient denies fever.

## 2018-06-27 NOTE — ED Provider Notes (Signed)
[facility]    CSN: [id number] Arrival date & time: 06/27/18  1020  History   Chief Complaint Chief Complaint  Patient presents with  . Cellulitis   HPI  32 year old female presents with concern for surgical site infection.  Patient states that she had a laparoscopic hysterectomy on 12/10.  [place] physician that she was previously seeing.  He specializes in endometriosis.  Patient states that yesterday the area started itching and has been draining.  She states that the wound is foul-smelling.  The site is at her umbilical incision.  She has noticed drainage from the area.  Steri-Strips are still intact.  They have not been removed.  No fever.  No chills.  No medications or topicals applied.  No other reported symptoms.  No other complaints.  PMH, Surgical Hx, Family Hx, Social History reviewed and updated as below.  Past Medical History:  Diagnosis Date  . Anemia   . Endometriosis   . GERD (gastroesophageal reflux disease)    OTC CHEWABLES (LIKE TUMS PER PT)  . Headache   . Ileus, postoperative [facility](HCC)    Past Surgical History:  Procedure Laterality Date  . ABDOMINAL HYSTERECTOMY    . APPENDECTOMY  2015  . COLONOSCOPY  2009  . DIAGNOSTIC LAPAROSCOPY  2014  . IUD REMOVAL N/A 04/20/2017   Procedure: INTRAUTERINE DEVICE (IUD) REMOVAL;  Surgeon: Christeen DouglasBeasley, Bethany, MD;  Location: [facility] ORS;  Service: Gynecology;  Laterality: N/A;  . OVARIAN CYST REMOVAL    . TONSILLECTOMY     as a child  . WISDOM TOOTH EXTRACTION  2009    OB History   No obstetric history on file.     Home Medications    Prior to Admission medications   Medication Sig Start Date End Date Taking? Authorizing Provider  cetirizine (ZYRTEC) 10 MG tablet Take 10 mg by mouth daily.   Yes [provider]  HYDROcodone-acetaminophen (NORCO) 7.5-325 MG tablet Take 1 tablet by mouth every 6 (six) hours as needed for moderate pain.   Yes [provider]    ibuprofen (ADVIL,MOTRIN) 800 MG tablet Take 1 tablet (800 mg total) by mouth every 8 (eight) hours as needed for moderate pain. 04/20/17  Yes Christeen DouglasBeasley, Bethany, MD  montelukast (SINGULAIR) 10 MG tablet Take 1 tablet by mouth at bedtime. 10/08/17 10/08/18 Yes [provider]  pantoprazole (PROTONIX) 20 MG tablet Take 1 tablet (20 mg total) by mouth daily. 01/20/18  Yes Lutricia Feiloemer, William P, PA-C  rizatriptan (MAXALT) 5 MG tablet Take 5 mg by mouth daily as needed for migraine. 10/07/13  Yes [provider]  doxycycline (VIBRAMYCIN) 100 MG capsule Take 1 capsule (100 mg total) by mouth 2 (two) times daily. 06/27/18   Tommie Samsook, Reeve Turnley G, DO  mupirocin ointment (BACTROBAN) 2 % Apply 1 application topically 2 (two) times daily for 7 days. 06/27/18 07/04/18  Tommie Samsook, Demarie Uhlig G, DO    Family History Family History  Problem Relation Age of Onset  . Healthy Mother   . Cancer Mother   . Heart disease Father   . Hyperlipidemia Father     Social History Social History   Tobacco Use  . Smoking status: Never Smoker  . Smokeless tobacco: Never Used  Substance Use Topics  . Alcohol use: Yes    Comment: 2-3 beer or wine weekly  . Drug use: No     Allergies   Ondansetron and Percocet [oxycodone-acetaminophen]   Review of Systems Review of Systems  Constitutional: Negative for fever.  Skin: Positive for wound.   Physical Exam Triage Vital Signs ED Triage Vitals  Enc Vitals Group     BP 06/27/18 1046 118/88     Pulse Rate 06/27/18 1046 81     Resp 06/27/18 1046 16     Temp 06/27/18 1046 98 F (36.7 C)     Temp Source 06/27/18 1046 Oral     SpO2 06/27/18 1046 100 %     Weight 06/27/18 1041 145 lb (65.8 kg)     Height 06/27/18 1041 5\' 4"  (1.626 m)     Head Circumference --      Peak Flow --      Pain Score 06/27/18 1041 0     Pain Loc --      Pain Edu? --      Excl. in GC? --    Updated Vital Signs BP 118/88 (BP Location: Left Arm)   Pulse 81   Temp 98 F (36.7 C) (Oral)    Resp 16   Ht 5\' 4"  (1.626 m)   Wt 65.8 kg   LMP 05/07/2018 (Exact Date)   SpO2 100%   BMI 24.89 kg/m   Visual Acuity Right Eye Distance:   Left Eye Distance:   Bilateral Distance:    Right Eye Near:   Left Eye Near:    Bilateral Near:     Physical Exam Vitals signs and nursing note reviewed.  Constitutional:      Appearance: Normal appearance.  HENT:     Head: Normocephalic and atraumatic.     Nose: Nose normal.  Eyes:     General: No scleral icterus.    Conjunctiva/sclera: Conjunctivae normal.  Pulmonary:     Effort: Pulmonary effort is normal. No respiratory distress.  Skin:    Comments: Patient with erythema around her umbilicus.  No appreciable purulence at this time. It does smell foul.  Neurological:     Mental Status: She is alert.  Psychiatric:        Mood and Affect: Mood normal.        Thought Content: Thought content normal.    UC Treatments / Results  Labs (all labs ordered are listed, but only abnormal results are displayed) Labs Reviewed - No data to display  EKG None  Radiology No results found.  Procedures Procedures (including critical care time)  Medications Ordered in UC Medications - No data to display  Initial Impression / Assessment and Plan / UC Course  I have reviewed the triage vital signs and the nursing notes.  Pertinent labs & imaging results that were available during my care of the patient were reviewed by me and considered in my medical decision making (see chart for details).    32 year old female presents with surgical site infection.  Treated with doxycycline and mupirocin.  Final Clinical Impressions(s) / UC Diagnoses   Final diagnoses:  Surgical site infection     Discharge Instructions     Medication as prescribed.  Take care and merry Christmas.  Dr. Adriana Simasook    ED Prescriptions    Medication Sig Dispense Auth. Provider   doxycycline (VIBRAMYCIN) 100 MG capsule Take 1 capsule (100 mg total) by mouth 2  (two) times daily. 14 capsule Jaxon Flatt G, DO   mupirocin ointment (BACTROBAN) 2 % Apply 1 application topically 2 (two) times daily for 7 days. 22 g Tommie Samsook, Makalyn Lennox G, DO     Controlled Substance Prescriptions [place] Controlled Substance Registry consulted? Not Applicable  Tommie Sams, [place] 06/27/18 1151

## 2018-08-21 ENCOUNTER — Ambulatory Visit
Admission: EM | Admit: 2018-08-21 | Discharge: 2018-08-21 | Disposition: A | Discharge status: Home / Self Care | Payer: Commercial Managed Care - PPO | Attending: Family Medicine | Admitting: Family Medicine

## 2018-08-21 ENCOUNTER — Encounter: Payer: Self-pay | Admitting: Emergency Medicine

## 2018-08-21 ENCOUNTER — Other Ambulatory Visit: Payer: Self-pay

## 2018-08-21 DIAGNOSIS — N3289 Other specified disorders of bladder: Secondary | ICD-10-CM | POA: Diagnosis present

## 2018-08-21 DIAGNOSIS — R301 Vesical tenesmus: Secondary | ICD-10-CM

## 2018-08-21 LAB — URINALYSIS, COMPLETE (UACMP) WITH MICROSCOPIC
BACTERIA UA: NONE SEEN
BILIRUBIN URINE: NEGATIVE
Glucose, UA: NEGATIVE mg/dL
Hgb urine dipstick: NEGATIVE
KETONES UR: NEGATIVE mg/dL
LEUKOCYTE UA: NEGATIVE
Nitrite: NEGATIVE
PH: 7 (ref 5.0–8.0)
Protein, ur: NEGATIVE mg/dL
SQUAMOUS EPITHELIAL / LPF: NONE SEEN (ref 0–5)
Specific Gravity, Urine: 1.02 (ref 1.005–1.030)
WBC, UA: NONE SEEN WBC/hpf (ref 0–5)

## 2018-08-21 NOTE — ED Triage Notes (Signed)
Patient states she has noticed some particulates in her Urine.

## 2018-08-21 NOTE — Discharge Instructions (Addendum)
Your urine was negative. Drink plenty of water. May use OTC AZO as label directed for bladder spasms, follow up with surgeon in [place] as needed for further evaluation or PCP. Return to UC as needed.

## 2018-08-21 NOTE — ED Provider Notes (Signed)
[facility]    CSN: [id number] Arrival date & time: 08/21/18  1756     History   Chief Complaint Chief Complaint  Patient presents with  . Recurrent UTI    HPI Rachel Stafford is a 33 y.o. female.   The history is provided by the patient. No language interpreter was used.  Dysuria  Pain quality:  Aching Pain severity:  Mild Onset quality:  Sudden Duration:  1 day Timing:  Sporadic Progression:  Unchanged Chronicity:  New Recent urinary tract infections: no   Relieved by:  Nothing Worsened by:  Nothing Ineffective treatments:  None tried Urinary symptoms comment:  Particulate in urine today, recently had hysterectomy in [place] 06/2018 Associated symptoms: no abdominal pain, no fever, no flank pain, no vaginal discharge and no vomiting   Risk factors: recurrent urinary tract infections   Risk factors comment:  Last UTI was months ago   Past Medical History:  Diagnosis Date  . Anemia   . Endometriosis   . GERD (gastroesophageal reflux disease)    OTC CHEWABLES (LIKE TUMS PER PT)  . Headache   . Ileus, postoperative [facility])     Patient Active Problem List   Diagnosis Date Noted  . Bladder spasms 08/21/2018    Past Surgical History:  Procedure Laterality Date  . ABDOMINAL HYSTERECTOMY    . APPENDECTOMY  2015  . COLONOSCOPY  2009  . DIAGNOSTIC LAPAROSCOPY  2014  . IUD REMOVAL N/A 04/20/2017   Procedure: INTRAUTERINE DEVICE (IUD) REMOVAL;  Surgeon: [name], MD;  Location: [facility] ORS;  Service: Gynecology;  Laterality: N/A;  . OVARIAN CYST REMOVAL    . TONSILLECTOMY     as a child  . WISDOM TOOTH EXTRACTION  2009    OB History   No obstetric history on file.      Home Medications    Prior to Admission medications   Medication Sig Start Date End Date Taking? Authorizing Provider  cetirizine (ZYRTEC) 10 MG tablet Take 10 mg by mouth daily.   Yes [provider]  montelukast (SINGULAIR) 10 MG tablet Take 1 tablet by  mouth at bedtime. 10/08/17 10/08/18 Yes [provider]  rizatriptan (MAXALT) 5 MG tablet Take 5 mg by mouth daily as needed for migraine. 10/07/13  Yes [provider]  doxycycline (VIBRAMYCIN) 100 MG capsule Take 1 capsule (100 mg total) by mouth 2 (two) times daily. 06/27/18   Tommie Sams, DO  HYDROcodone-acetaminophen (NORCO) 7.5-325 MG tablet Take 1 tablet by mouth every 6 (six) hours as needed for moderate pain.    [provider]  ibuprofen (ADVIL,MOTRIN) 800 MG tablet Take 1 tablet (800 mg total) by mouth every 8 (eight) hours as needed for moderate pain. 04/20/17   [name], MD  pantoprazole (PROTONIX) 20 MG tablet Take 1 tablet (20 mg total) by mouth daily. 01/20/18   Lutricia Feil, PA-C    Family History Family History  Problem Relation Age of Onset  . Healthy Mother   . Cancer Mother   . Heart disease Father   . Hyperlipidemia Father     Social History Social History   Tobacco Use  . Smoking status: Never Smoker  . Smokeless tobacco: Never Used  Substance Use Topics  . Alcohol use: Yes    Comment: 2-3 beer or wine weekly  . Drug use: No     Allergies   Ondansetron and Percocet [oxycodone-acetaminophen]   Review of Systems Review of Systems  Constitutional: Negative for  fever.  Gastrointestinal: Negative for abdominal pain and vomiting.  Genitourinary: Negative for difficulty urinating, dysuria, flank pain, frequency and vaginal discharge.       Bladder spasm  All other systems reviewed and are negative.    Physical Exam Triage Vital Signs ED Triage Vitals  Enc Vitals Group     BP 08/21/18 1835 100/72     Pulse Rate 08/21/18 1835 69     Resp 08/21/18 1835 16     Temp 08/21/18 1835 97.8 F (36.6 C)     Temp Source 08/21/18 1835 Oral     SpO2 08/21/18 1835 100 %     Weight 08/21/18 1832 145 lb (65.8 kg)     Height 08/21/18 1832 5\' 4"  (1.626 m)     Head Circumference --      Peak Flow --      Pain Score 08/21/18  1832 0     Pain Loc --      Pain Edu? --      Excl. in GC? --    No data found.  Updated Vital Signs BP 100/72   Pulse 69   Temp 97.8 F (36.6 C) (Oral)   Resp 16   Ht 5\' 4"  (1.626 m)   Wt 145 lb (65.8 kg)   LMP 05/07/2018 (Exact Date)   SpO2 100%   BMI 24.89 kg/m    Physical Exam Vitals signs and nursing note reviewed.  Constitutional:      General: She is awake. She is not in acute distress.    Appearance: Normal appearance. She is well-developed.  HENT:     Head: Normocephalic.  Eyes:     Pupils: Pupils are equal, round, and reactive to light.  Neck:     Musculoskeletal: Normal range of motion.  Cardiovascular:     Rate and Rhythm: Normal rate and regular rhythm.     Pulses: Normal pulses.  Pulmonary:     Effort: Pulmonary effort is normal.     Breath sounds: Normal breath sounds.  Abdominal:     General: Abdomen is flat. Bowel sounds are normal.     Palpations: Abdomen is soft.     Tenderness: There is no abdominal tenderness. There is no right CVA tenderness or left CVA tenderness.  Musculoskeletal: Normal range of motion.  Skin:    General: Skin is warm and dry.  Neurological:     General: No focal deficit present.     Mental Status: She is alert and oriented to person, place, and time.     GCS: GCS eye subscore is 4. GCS verbal subscore is 5. GCS motor subscore is 6.  Psychiatric:        Speech: Speech normal.        Behavior: Behavior normal. Behavior is cooperative.      UC Treatments / Results  Labs (all labs ordered are listed, but only abnormal results are displayed) Labs Reviewed  URINALYSIS, COMPLETE (UACMP) WITH MICROSCOPIC - Abnormal; Notable for the following components:      Result Value   APPearance HAZY (*)    All other components within normal limits    EKG None  Radiology No results found.  Procedures Procedures (including critical care time)  Medications Ordered in UC Medications - No data to display  Initial  Impression / Assessment and Plan / UC Course  I have reviewed the triage vital signs and the nursing notes.  Pertinent labs & imaging results that were available during my care  of the patient were reviewed by me and considered in my medical decision making (see chart for details).      Final Clinical Impressions(s) / UC Diagnoses   Final diagnoses:  Bladder spasms     Discharge Instructions     Your urine was negative. Drink plenty of water. May use OTC AZO as label directed for bladder spasms, follow up with surgeon in [place] as needed for further evaluation or PCP. Return to UC as needed.     ED Prescriptions    None     Controlled Substance Prescriptions    Clancy Gourd, NP 08/21/18 1913

## 2020-02-06 ENCOUNTER — Ambulatory Visit
Admission: EM | Admit: 2020-02-06 | Discharge: 2020-02-06 | Disposition: A | Discharge status: Home / Self Care | Payer: 59

## 2020-02-06 ENCOUNTER — Ambulatory Visit (INDEPENDENT_AMBULATORY_CARE_PROVIDER_SITE_OTHER): Payer: 59

## 2020-02-06 ENCOUNTER — Other Ambulatory Visit: Payer: Self-pay

## 2020-02-06 ENCOUNTER — Encounter: Payer: Self-pay | Admitting: Emergency Medicine

## 2020-02-06 DIAGNOSIS — S63642A Sprain of metacarpophalangeal joint of left thumb, initial encounter: Secondary | ICD-10-CM

## 2020-02-06 NOTE — ED Triage Notes (Signed)
Patient c/o left thumb pain that started 3 weeks ago.  Patient states that she got her left thumb caught in fencing and fell off her ladder on 12/31/19.

## 2020-02-26 NOTE — ED Provider Notes (Signed)
[facility]    CSN: [id number] Arrival date & time: 02/06/20  1131      History   Chief Complaint Chief Complaint  Patient presents with  . finger pain    left thumb     HPI Rachel Stafford is a 34 y.o. female comes to the urgent care with complaints of left ankle pain which started 3 weeks ago.  Patient endorses injury to the left thumb after her thumb was caught in fencing during a fall from a ladder.  No swelling at this time.  Patient continues to have mild pain in the left arm.  She wanted to be evaluated to rule out fracture of the left thumb.   HPI  Past Medical History:  Diagnosis Date  . Anemia   . Endometriosis   . GERD (gastroesophageal reflux disease)    OTC CHEWABLES (LIKE TUMS PER PT)  . Headache   . Ileus, postoperative [facility])     Patient Active Problem List   Diagnosis Date Noted  . Bladder spasms 08/21/2018    Past Surgical History:  Procedure Laterality Date  . ABDOMINAL HYSTERECTOMY    . APPENDECTOMY  2015  . COLONOSCOPY  2009  . DIAGNOSTIC LAPAROSCOPY  2014  . IUD REMOVAL N/A 04/20/2017   Procedure: INTRAUTERINE DEVICE (IUD) REMOVAL;  Surgeon: [name], MD;  Location: [facility] ORS;  Service: Gynecology;  Laterality: N/A;  . OVARIAN CYST REMOVAL    . TONSILLECTOMY     as a child  . WISDOM TOOTH EXTRACTION  2009    OB History   No obstetric history on file.      Home Medications    Prior to Admission medications   Medication Sig Start Date End Date Taking? Authorizing Provider  levocetirizine (XYZAL) 5 MG tablet Take by mouth.   Yes [provider]  naproxen (NAPROSYN) 500 MG tablet Take by mouth. 11/16/16  Yes [provider]  rizatriptan (MAXALT) 5 MG tablet Take 5 mg by mouth daily as needed for migraine. 10/07/13  Yes [provider]  ibuprofen (ADVIL,MOTRIN) 800 MG tablet Take 1 tablet (800 mg total) by mouth every 8 (eight) hours as needed for moderate pain. 04/20/17   [name], MD  montelukast (SINGULAIR) 10 MG tablet Take 1 tablet by mouth at bedtime. 10/08/17 10/08/18  [provider]  cetirizine (ZYRTEC) 10 MG tablet Take 10 mg by mouth daily.  02/06/20  [provider]  pantoprazole (PROTONIX) 20 MG tablet Take 1 tablet (20 mg total) by mouth daily. 01/20/18 02/06/20  Lutricia Feil, PA-C    Family History Family History  Problem Relation Age of Onset  . Healthy Mother   . Cancer Mother   . Heart disease Father   . Hyperlipidemia Father     Social History Social History   Tobacco Use  . Smoking status: Never Smoker  . Smokeless tobacco: Never Used  Vaping Use  . Vaping Use: Never used  Substance Use Topics  . Alcohol use: Yes    Comment: 2-3 beer or wine weekly  . Drug use: No     Allergies   Ondansetron and Percocet [oxycodone-acetaminophen]   Review of Systems Review of Systems  HENT: Negative.   Musculoskeletal: Positive for arthralgias. Negative for joint swelling and myalgias.  Skin: Negative.   Neurological: Negative.      Physical Exam Triage Vital Signs ED Triage Vitals  Enc Vitals Group     BP 02/06/20 1215 111/85  Pulse Rate 02/06/20 1215 60     Resp 02/06/20 1215 14     Temp 02/06/20 1215 98.2 F (36.8 C)     Temp Source 02/06/20 1215 Oral     SpO2 02/06/20 1215 100 %     Weight 02/06/20 1212 135 lb (61.2 kg)     Height 02/06/20 1212 5\' 4"  (1.626 m)     Head Circumference --      Peak Flow --      Pain Score 02/06/20 1212 2     Pain Loc --      Pain Edu? --      Excl. in GC? --    No data found.  Updated Vital Signs BP 111/85 (BP Location: Right Arm)   Pulse 60   Temp 98.2 F (36.8 C) (Oral)   Resp 14   Ht 5\' 4"  (1.626 m)   Wt 61.2 kg   LMP 05/07/2018 (Exact Date)   SpO2 100%   BMI 23.17 kg/m   Visual Acuity Right Eye Distance:   Left Eye Distance:   Bilateral Distance:    Right Eye Near:   Left Eye Near:    Bilateral Near:     Physical Exam Musculoskeletal:          General: Normal range of motion.     Comments: Full range of motion around themetacarpophalangeal and interphalangeal joints of the left thumb.  No swelling or erythema.  No tenderness on palpation around the joints.      UC Treatments / Results  Labs (all labs ordered are listed, but only abnormal results are displayed) Labs Reviewed - No data to display  EKG   Radiology No results found.  Procedures Procedures (including critical care time)  Medications Ordered in UC Medications - No data to display  Initial Impression / Assessment and Plan / UC Course  I have reviewed the triage vital signs and the nursing notes.  Pertinent labs & imaging results that were available during my care of the patient were reviewed by me and considered in my medical decision making (see chart for details).     1.  Sprain of the metacarpophalangeal joints of the left thumb: Left thumb spica applied X-ray of the left is negative for acute fracture Over-the-counter pain medications as needed Return precautions given Final Clinical Impressions(s) / UC Diagnoses   Final diagnoses:  Sprain of metacarpophalangeal (MCP) joint of left thumb, initial encounter   Discharge Instructions   None    ED Prescriptions    None     PDMP not reviewed this encounter.   , MD 02/26/20 1243

## 2020-05-10 ENCOUNTER — Ambulatory Visit
Admission: EM | Admit: 2020-05-10 | Discharge: 2020-05-10 | Disposition: A | Discharge status: Home / Self Care | Payer: Managed Care, Other (non HMO) | Attending: Family Medicine | Admitting: Family Medicine

## 2020-05-10 ENCOUNTER — Other Ambulatory Visit: Payer: Self-pay

## 2020-05-10 DIAGNOSIS — J988 Other specified respiratory disorders: Secondary | ICD-10-CM

## 2020-05-10 DIAGNOSIS — B9789 Other viral agents as the cause of diseases classified elsewhere: Secondary | ICD-10-CM

## 2020-05-10 MED ORDER — PREDNISONE 50 MG PO TABS: ORAL_TABLET | ORAL | 0 refills | Status: DC

## 2020-05-10 MED ORDER — BENZONATATE 200 MG PO CAPS: 200.0000 mg | ORAL_CAPSULE | Freq: Three times a day (TID) | ORAL | 0 refills | Status: AC | PRN

## 2020-05-10 MED ORDER — MONTELUKAST SODIUM 10 MG PO TABS: 10.0000 mg | ORAL_TABLET | Freq: Every day | ORAL | 1 refills | Status: DC

## 2020-05-10 MED ORDER — ALBUTEROL SULFATE HFA 108 (90 BASE) MCG/ACT IN AERS: 1.0000 | INHALATION_SPRAY | Freq: Four times a day (QID) | RESPIRATORY_TRACT | 1 refills | Status: AC | PRN

## 2020-05-10 NOTE — Discharge Instructions (Signed)
Rest.  Fluids.  Medications as prescribed.  If you worsen, please let us know.  Take care  Dr. Adriana Simas

## 2020-05-10 NOTE — ED Triage Notes (Signed)
Patient presents to Urgent Care with complaints of wheezing and sore throat since 3 days ago. Patient reports she has asthma, developed a cough last night (productive), fever as well, no antipyretics yet today.  Pt has been vaccinated for covid, no recent covid tests.

## 2020-05-10 NOTE — ED Provider Notes (Signed)
[facility]    CSN: [id number] Arrival date & time: 05/10/20  0849      History   Chief Complaint Chief Complaint  Patient presents with  . Sore Throat   HPI   34 year old female presents with respiratory symptoms.  Started on Friday.  Reports wheezing, congestion, cough, sore throat, ear pain/pressure, and mildly elevated temperature of 100.  No reported sick contacts.  Pain 4/10 in severity.  No relieving factors.  She is currently afebrile.  She has been vaccinated against COVID-19.  Patient has upcoming travel.  No other reported symptoms.  No other complaints.  Past Medical History:  Diagnosis Date  . Anemia   . Endometriosis   . GERD (gastroesophageal reflux disease)    OTC CHEWABLES (LIKE TUMS PER PT)  . Headache   . Ileus, postoperative [facility])    Patient Active Problem List   Diagnosis Date Noted  . Bladder spasms 08/21/2018   Past Surgical History:  Procedure Laterality Date  . ABDOMINAL HYSTERECTOMY    . APPENDECTOMY  2015  . COLONOSCOPY  2009  . DIAGNOSTIC LAPAROSCOPY  2014  . IUD REMOVAL N/A 04/20/2017   Procedure: INTRAUTERINE DEVICE (IUD) REMOVAL;  Surgeon: [name], MD;  Location: [facility] ORS;  Service: Gynecology;  Laterality: N/A;  . OVARIAN CYST REMOVAL    . TONSILLECTOMY     as a child  . WISDOM TOOTH EXTRACTION  2009   OB History   No obstetric history on file.    Home Medications    Prior to Admission medications   Medication Sig Start Date End Date Taking? Authorizing Provider  albuterol (VENTOLIN HFA) 108 (90 Base) MCG/ACT inhaler Inhale 1-2 puffs into the lungs every 6 (six) hours as needed for wheezing or shortness of breath. 05/10/20   Tommie Sams, DO  benzonatate (TESSALON) 200 MG capsule Take 1 capsule (200 mg total) by mouth 3 (three) times daily as needed for cough. 05/10/20   Tommie Sams, DO  ibuprofen (ADVIL,MOTRIN) 800 MG tablet Take 1 tablet (800 mg total) by mouth every 8 (eight) hours as needed for moderate  pain. 04/20/17   [name], MD  levocetirizine (XYZAL) 5 MG tablet Take by mouth.    [provider]  montelukast (SINGULAIR) 10 MG tablet Take 1 tablet (10 mg total) by mouth at bedtime. 05/10/20 05/10/21  Tommie Sams, DO  naproxen (NAPROSYN) 500 MG tablet Take by mouth. 11/16/16   [provider]  predniSONE (DELTASONE) 50 MG tablet 1 tablet daily x 5 days 05/10/20   Tommie Sams, DO  rizatriptan (MAXALT) 5 MG tablet Take 5 mg by mouth daily as needed for migraine. 10/07/13   [provider]  cetirizine (ZYRTEC) 10 MG tablet Take 10 mg by mouth daily.  02/06/20  [provider]  pantoprazole (PROTONIX) 20 MG tablet Take 1 tablet (20 mg total) by mouth daily. 01/20/18 02/06/20  Lutricia Feil, PA-C    Family History Family History  Problem Relation Age of Onset  . Healthy Mother   . Cancer Mother   . Heart disease Father   . Hyperlipidemia Father     Social History Social History   Tobacco Use  . Smoking status: Never Smoker  . Smokeless tobacco: Never Used  Vaping Use  . Vaping Use: Never used  Substance Use Topics  . Alcohol use: Yes    Comment: 2-3 beer or wine weekly  . Drug use: No     Allergies  Ondansetron and Percocet [oxycodone-acetaminophen]   Review of Systems Review of Systems  HENT: Positive for congestion and ear pain.   Respiratory: Positive for cough and wheezing.    Physical Exam Triage Vital Signs ED Triage Vitals  Enc Vitals Group     BP 05/10/20 0901 119/89     Pulse Rate 05/10/20 0901 89     Resp 05/10/20 0901 14     Temp 05/10/20 0901 98.4 F (36.9 C)     Temp Source 05/10/20 0901 Oral     SpO2 05/10/20 0901 100 %     Weight --      Height --      Head Circumference --      Peak Flow --      Pain Score 05/10/20 0859 4     Pain Loc --      Pain Edu? --      Excl. in GC? --    Updated Vital Signs BP 119/89 (BP Location: Left Arm)   Pulse 89   Temp 98.4 F (36.9 C) (Oral)   Resp 14   LMP  05/07/2018 (Exact Date)   SpO2 100%   Visual Acuity Right Eye Distance:   Left Eye Distance:   Bilateral Distance:    Right Eye Near:   Left Eye Near:    Bilateral Near:     Physical Exam Vitals and nursing note reviewed.  Constitutional:      General: She is not in acute distress.    Appearance: Normal appearance. She is not ill-appearing.  HENT:     Head: Normocephalic and atraumatic.     Right Ear: Tympanic membrane normal.     Left Ear: Tympanic membrane normal.     Mouth/Throat:     Pharynx: Oropharynx is clear. No oropharyngeal exudate or posterior oropharyngeal erythema.  Eyes:     General:        Right eye: No discharge.        Left eye: No discharge.     Conjunctiva/sclera: Conjunctivae normal.  Cardiovascular:     Rate and Rhythm: Normal rate and regular rhythm.  Pulmonary:     Effort: Pulmonary effort is normal.     Breath sounds: Normal breath sounds. No wheezing, rhonchi or rales.  Neurological:     Mental Status: She is alert.  Psychiatric:        Mood and Affect: Mood normal.        Behavior: Behavior normal.    UC Treatments / Results  Labs (all labs ordered are listed, but only abnormal results are displayed) Labs Reviewed - No data to display  EKG   Radiology No results found.  Procedures Procedures (including critical care time)  Medications Ordered in UC Medications - No data to display  Initial Impression / Assessment and Plan / UC Course  I have reviewed the triage vital signs and the nursing notes.  Pertinent labs & imaging results that were available during my care of the patient were reviewed by me and considered in my medical decision making (see chart for details).    34 year old female presents with a viral respiratory infection.  Patient declines additional testing today.  I suspect that this is viral in origin.  Treating with albuterol, Tessalon Perles, prednisone.  I have refilled the patient's Singulair.  Supportive  care.  Final Clinical Impressions(s) / UC Diagnoses   Final diagnoses:  Viral respiratory infection     Discharge Instructions     Rest.  Fluids.  Medications as prescribed.  If you worsen, please let us know.  Take care  Dr. Adriana Simas    ED Prescriptions    Medication Sig Dispense Auth. Provider   montelukast (SINGULAIR) 10 MG tablet Take 1 tablet (10 mg total) by mouth at bedtime. 90 tablet Telsa Dillavou G, DO   albuterol (VENTOLIN HFA) 108 (90 Base) MCG/ACT inhaler Inhale 1-2 puffs into the lungs every 6 (six) hours as needed for wheezing or shortness of breath. 18 g Tameah Mihalko G, DO   predniSONE (DELTASONE) 50 MG tablet 1 tablet daily x 5 days 5 tablet Eusebia Grulke G, DO   benzonatate (TESSALON) 200 MG capsule Take 1 capsule (200 mg total) by mouth 3 (three) times daily as needed for cough. 30 capsule Tommie Sams, DO     PDMP not reviewed this encounter.   Everlene Other Geraldine, [place] 05/10/20 [phone]

## 2020-08-13 ENCOUNTER — Telehealth: Payer: Self-pay

## 2020-08-13 NOTE — Telephone Encounter (Signed)
Copied from CRM [id number]. Topic: General - Other >> Aug 13, 2020  9:31 AM Jaquita Rector A wrote: Reason for CRM: Patient called in requesting Joycelyn Man to be her PCP please call with an answer to Ph# [phone]

## 2020-08-13 NOTE — Telephone Encounter (Signed)
Advised patient that Rachel Stafford is not accepting new patients. However, patient is willing to see Marcelino Duster or Ricki Rodriguez. Please schedule new pt appt with first available. Thanks!

## 2020-10-15 ENCOUNTER — Telehealth: Payer: Self-pay

## 2020-10-15 NOTE — Telephone Encounter (Signed)
Called pt for precharting on meds- can see where she was or is taking Adderall. Left a message stating that she will need to have a doctor prescribe this medicine for her as Dr Yetta Barre will be unable to take over this medication.

## 2020-10-19 ENCOUNTER — Ambulatory Visit: Payer: Managed Care, Other (non HMO) | Admitting: Family Medicine

## 2022-02-13 IMAGING — CR DG FINGER THUMB 2+V*L*
3 series · 3 of 3 positions shown · non-contrast
Comparison: None.

CLINICAL DATA: Injury.  Thumb pain.

EXAM:
LEFT THUMB 2+V

[finger ap]
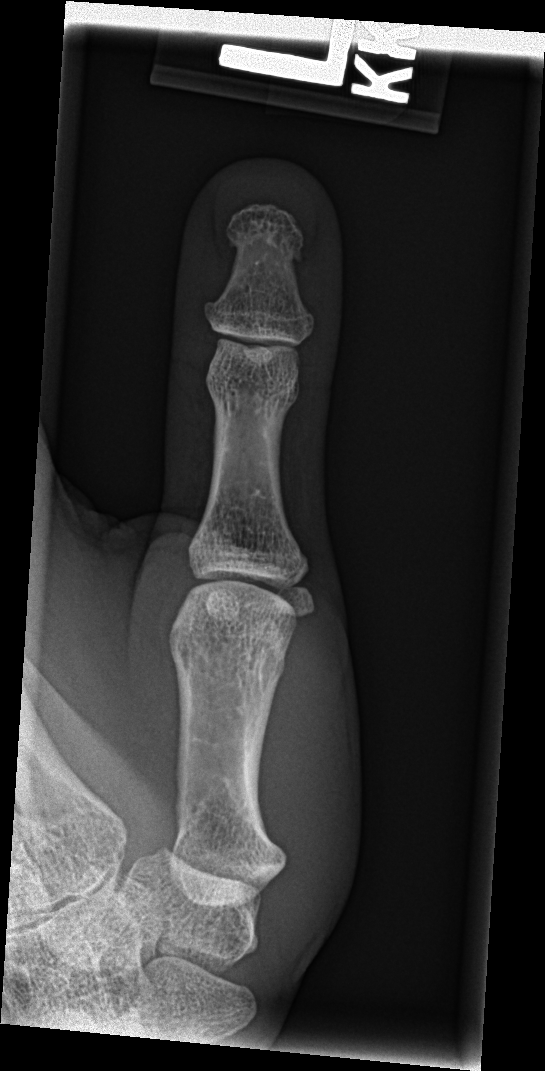

[finger obl]
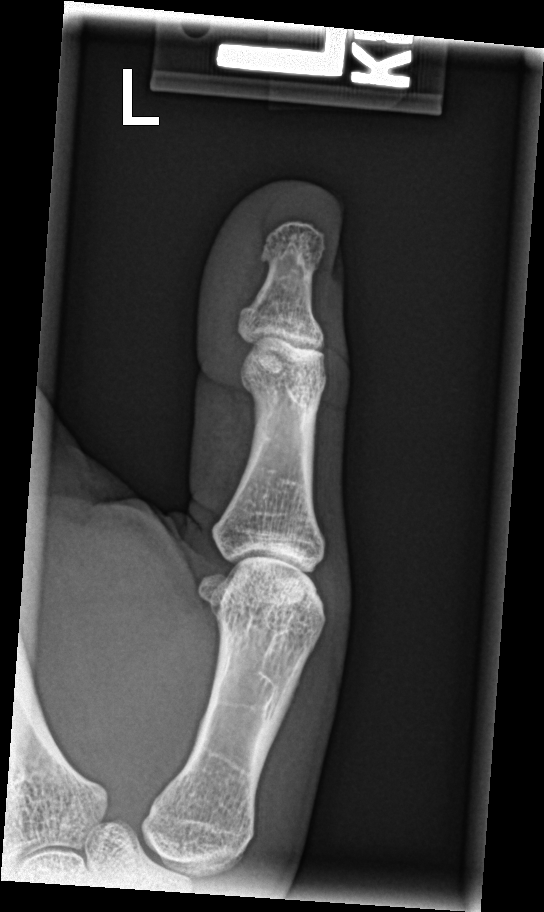

[finger lat]
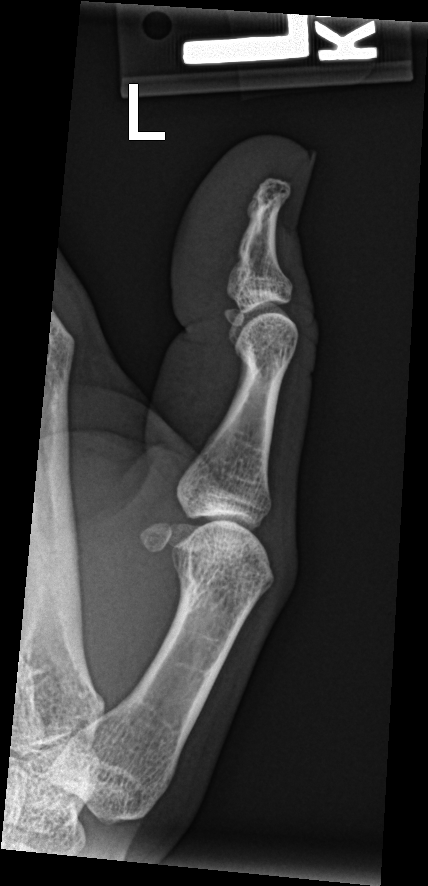

[3 of 3 positions shown; findings below may reference images not displayed]

FINDINGS: No acute bony or joint abnormality. No evidence of fracture
dislocation. No radiopaque foreign body.
IMPRESSION: No acute abnormality identified.

## 2024-03-04 NOTE — Progress Notes (Signed)
 Patient ID:  Rachel Stafford is a 38 y.o. (DOB Oct 26, 1985) female.  Assessment and Plan   1. Annual physical exam   2. Screening for deficiency anemia   3. Screening for lipid disorders   4. Screening for diabetes mellitus   5. Bipolar affective disorder, remission status unspecified (*)   6. History of hysterectomy   7. Acne, unspecified acne type   8. Asthma due to seasonal allergies (*)   9. Family history of melanoma   10. Encounter to establish care     Assessment & Plan 1-4. Health maintenance/health promotion issues appropriate to age reviewed. Immunizations are current. Recommend healthy diet and regular exercise. Recommend multivitamin daily. Cardiovascular screening with blood pressure and blood work (CMP, Lipid panel). Diabetes screening with fasting glucose in those patients who are not diabetic. Depression Screening/Awareness.    Fasting blood work pending: CBC, CMP, Lipid panel, A1C, and TSH S/p hysterectomy, she states cervix was removed.  Recommend monthly self breast exams Mammograms annually starting at age 36 Colonoscopy screening at age 45  5. History of bipolar affective disorder diagnosed three years ago. Diagnostic plan: Referred to [facility] for psychiatric care. If issues arise with the initial psychiatrist, inform the clinic for a potential referral to another provider.  Weight gain two years ago, possibly related to bipolar medication. Treatment plan: Currently on metformin 2250 mg daily for weight management and PCOS. Weight gain stabilized since increasing metformin dosage.  6. Hysterectomy five years ago due to endometriosis and adenomyosis. Reports significant improvement since surgery.  7. Experiences acne, worsens when discontinuing spironolactone. Treatment plan: Continue spironolactone 100 mg daily.  9. Stable.  10. Ref to derm.   Results    Patient understands and agrees with plan. Computer technology was  used to create visit note. Consent from the patient/caregiver was obtained prior to use.  No follow-ups on file.    Patient's Medications       * Accurate as of March 04, 2024 11:41 AM. Reflects encounter med changes as of last refresh          Continued Medications      Instructions  albuterol  sulfate HFA 108 (90 Base) MCG/ACT inhaler Commonly known as: PROVENTIL ,VENTOLIN ,PROAIR   2 puffs, Every 6 hours as needed   * dexmethylphenidate 2.5 mg tablet Commonly known as: FOCALIN    * dexmethylphenidate 10 mg 24 hr capsule Commonly known as: FOCALIN XR    levocetirizine 5 mg tablet Commonly known as: XYZAL  Take by mouth.   REXULTI 0.5 mg tablet Generic drug: brexpiprazole       * * This list has 2 medication(s) that are the same as other medications prescribed for you. Read the directions carefully, and ask your doctor or other care provider to review them with you.          Modified Medications      Instructions  metFORMIN HCl 750 MG Tabs What changed: See the new instructions. Changed by: Hoy Miu, PA-C  750 mg, Oral, 3 times a day   spironolactone 100 MG tablet Commonly known as: ALDACTONE What changed: See the new instructions. Changed by: Hoy Miu, PA-C  100 mg, Oral, Daily        Orders Placed This Encounter  Procedures  . CMP14+CBC/D/Plt+TSH  . Lipid panel  . Hemoglobin A1c  . Ambulatory referral to Obstetrics / Gynecology  . Ambulatory referral to Dermatology    Risks, benefits, and alternatives of the medications and treatment plan prescribed  today were discussed, and patient expressed understanding. Plan follow-up as discussed or as needed if any worsening symptoms or change in condition.    A yearly preventative health exam was recommended and current age based recommendations were discussed.   Subjective   Patient ID:  Rachel Stafford is a 38 y.o. (DOB Nov 17, 1985) female    Patient presents with  . Annual  Exam    Not fasting. No longer having the breast pain.      HPI:   History of Present Illness The patient is a 38 year old female presenting for an annual exam and to establish care. She has a history of bipolar affective disorder, anxiety, endometriosis, hysterectomy, irritable colon, migraines, and vertigo. Current medications include Rexulti, Focalin 10 mg and 2.5 mg, Xyzal 5 mg, metformin 750 mg, and spironolactone 100 mg.  She underwent a hysterectomy five years ago due to endometriosis and adenomyosis, including removal of her cervix, appendix, and one ovary. She has one functioning ovary remaining. Prior to the hysterectomy, she had six surgeries for excision and laser treatment of adhesions. She has not experienced Mittelschmerz in the past seven months and believes she is not ovulating.   Diagnosed with bipolar disorder three years ago, she has been referred to [facility] for psychiatric care. She reports dysphoria but no mania.  She has experienced left breast pain for the past 5.5 weeks, possibly hormone-related. The pain was severe but has subsided.  She typically consumes two meals and a snack daily, aiming to increase protein intake. She gained weight unexpectedly two years ago, possibly due to bipolar medication. She drinks a protein shake daily and is increasing her water intake. Her physical activity includes daily walks and a 10-minute workout with push-ups. She is sexually active with one partner and has no concerns about STDs. She last visited the dentist six months ago and plans to schedule an eye exam due to increased squinting. She has never had a skin check. She reports no black or dark stools. Her bowel movements are regular but frequent due to metformin. She is on metformin 2250 mg daily for weight loss and PCOS, which she tolerates well. She is also on spironolactone, which she has attempted to discontinue several times due to acne flare-ups.  She has allergies  and asthma.  Migraines have ceased since her hysterectomy.  Social History: Occupation: Therapist, music: Stained glass, crafting, hiking, fishing Diet: Typically consumes two meals and a snack daily, drinks a protein shake daily Alcohol: Socially Recreational Drugs: Marijuana Sleep: Wakes up at 3 AM every night, cyclical pattern Sexual Practices: Sexually active with one partner Living Condition: Lives with partner  PAST SURGICAL HISTORY: - Hysterectomy (including removal of cervix, appendix, and one ovary) - Six surgeries for excision and laser treatment of adhesions  FAMILY HISTORY Maternal grandmother: uterine and ovarian cancer.  Mother: skin cancer and melanoma diagnosed in her 54s, bipolar disorder.  Father: congestive heart failure.  Paternal grandmother: liver cancer.  Paternal grandfather: heart failure with valve replacements.    Past Medical History, Past Surgery History, Allergies, Social History, and Family History were reviewed and updated.    Review of Systems  Constitutional: Negative.   HENT: Negative.    Respiratory: Negative.    Cardiovascular: Negative.   Gastrointestinal: Negative.   Musculoskeletal: Negative.   Neurological: Negative.   All other systems reviewed and are negative.    Objective   Vitals:   03/04/24 0955  BP: 100/62  Patient Position: Sitting  Pulse: 77  Temp: 97.3 F (36.3 C)  TempSrc: Skin  Resp: 18  Height: 5' 5.5 (1.664 m)  Weight: 157 lb 3.2 oz (71.3 kg)  SpO2: 99%  BMI (Calculated): 25.8  PainSc: 0-No pain      Physical Exam Vitals and nursing note reviewed.  Constitutional:      General: She is not in acute distress.    Appearance: Normal appearance.  HENT:     Head: Normocephalic and atraumatic.     Right Ear: Tympanic membrane, ear canal and external ear normal.     Left Ear: Tympanic membrane, ear canal and external ear normal.     Nose: Nose normal.     Mouth/Throat:     Mouth: Mucous  membranes are moist.     Pharynx: Oropharynx is clear.  Eyes:     Extraocular Movements: Extraocular movements intact.     Pupils: Pupils are equal, round, and reactive to light.  Cardiovascular:     Rate and Rhythm: Normal rate and regular rhythm.     Heart sounds: Normal heart sounds.  Musculoskeletal:        General: Normal range of motion.     Cervical back: Normal range of motion. No tenderness.  Pulmonary:     Effort: Pulmonary effort is normal.     Breath sounds: Normal breath sounds.  Abdominal:     General: Abdomen is flat.     Palpations: Abdomen is soft.     Tenderness: There is no abdominal tenderness. There is no guarding or rebound.  Lymphadenopathy:     Cervical: No cervical adenopathy.  Skin:    General: Skin is warm and dry.  Neurological:     General: No focal deficit present.     Mental Status: She is alert and oriented to person, place, and time.  Psychiatric:        Mood and Affect: Mood normal.        Behavior: Behavior normal.        Thought Content: Thought content normal.        Judgment: Judgment normal.       Voice recognition software was used and creation of this note. Despite my best effort at editing the text, some misspelling/errors may have occurred. *Some images could not be shown.
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
## 2024-04-22 ENCOUNTER — Ambulatory Visit (INDEPENDENT_AMBULATORY_CARE_PROVIDER_SITE_OTHER): Admitting: Psychiatry

## 2024-04-22 ENCOUNTER — Encounter: Payer: Self-pay | Admitting: Psychiatry

## 2024-04-22 VITALS — BP 110/74 | HR 85 | Temp 98.2°F | Ht 66.5 in | Wt 154.0 lb

## 2024-04-22 DIAGNOSIS — F0630 Mood disorder due to known physiological condition, unspecified: Secondary | ICD-10-CM | POA: Diagnosis not present

## 2024-04-22 DIAGNOSIS — Z79899 Other long term (current) drug therapy: Secondary | ICD-10-CM | POA: Diagnosis not present

## 2024-04-22 DIAGNOSIS — R41840 Attention and concentration deficit: Secondary | ICD-10-CM | POA: Diagnosis not present

## 2024-04-22 NOTE — Patient Instructions (Addendum)
 Continue rexulti 0.5 mg daily  Obtain UDS Plan to restart Focalin 10 mg XR daily, 2.5 mg in the afternoon  Referral to ADHD evaluation Next appointment- 12/4 at at 8 AM

## 2024-05-09 ENCOUNTER — Ambulatory Visit: Payer: Self-pay | Admitting: Psychiatry

## 2024-05-09 LAB — TSH: TSH: 1.32 u[IU]/mL (ref 0.450–4.500)

## 2024-05-09 NOTE — Progress Notes (Signed)
 Please notify her that thyroid is within normal range.

## 2024-05-10 ENCOUNTER — Other Ambulatory Visit: Payer: Self-pay | Admitting: Psychiatry

## 2024-05-10 LAB — MONITOR DRUG PROFILE 10(MW)
Amphetamine Scrn, Ur: NEGATIVE ng/mL
BARBITURATE SCREEN URINE: NEGATIVE ng/mL
BENZODIAZEPINE SCREEN, URINE: NEGATIVE ng/mL
CANNABINOIDS UR QL SCN: NEGATIVE ng/mL
Cocaine (Metab) Scrn, Ur: NEGATIVE ng/mL
Creatinine(Crt), U: 22.7 mg/dL (ref 20.0–300.0)
Methadone Screen, Urine: NEGATIVE ng/mL
OXYCODONE+OXYMORPHONE UR QL SCN: NEGATIVE ng/mL
Opiate Scrn, Ur: NEGATIVE ng/mL
Ph of Urine: 6.4 (ref 4.5–8.9)
Phencyclidine Qn, Ur: NEGATIVE ng/mL
Propoxyphene Scrn, Ur: NEGATIVE ng/mL

## 2024-05-10 MED ORDER — DEXMETHYLPHENIDATE HCL ER 10 MG PO CP24: 10.0000 mg | ORAL_CAPSULE | ORAL | 0 refills | Status: AC

## 2024-05-10 MED ORDER — DEXMETHYLPHENIDATE HCL 2.5 MG PO TABS: 2.5000 mg | ORAL_TABLET | Freq: Every day | ORAL | 0 refills | Status: DC

## 2024-05-10 MED ORDER — DEXMETHYLPHENIDATE HCL 2.5 MG PO TABS: 2.5000 mg | ORAL_TABLET | Freq: Every day | ORAL | 0 refills | Status: AC

## 2024-05-10 MED ORDER — DEXMETHYLPHENIDATE HCL ER 10 MG PO CP24: 10.0000 mg | ORAL_CAPSULE | ORAL | 0 refills | Status: DC

## 2024-05-10 NOTE — Progress Notes (Signed)
 Reviewed UDS. Please inform her that Focalin has been sent to the pharmacy as discussed. TSH/thyroid test was normal as well.

## 2024-05-12 NOTE — Progress Notes (Signed)
 Called patient to discuss labs no answer left voicemail for patient to return call to office

## 2024-05-12 NOTE — Progress Notes (Signed)
 Patient returned called discussed labs and made her aware that her focalin had been sent to the pharmacy she voiced understanding
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
## 2024-06-12 ENCOUNTER — Encounter: Payer: Self-pay | Admitting: Psychiatry

## 2024-06-12 ENCOUNTER — Other Ambulatory Visit: Payer: Self-pay

## 2024-06-12 ENCOUNTER — Ambulatory Visit (INDEPENDENT_AMBULATORY_CARE_PROVIDER_SITE_OTHER): Admitting: Psychiatry

## 2024-06-12 VITALS — BP 104/72 | HR 93 | Temp 98.0°F | Ht 66.5 in | Wt 153.8 lb

## 2024-06-12 DIAGNOSIS — Z79899 Other long term (current) drug therapy: Secondary | ICD-10-CM

## 2024-06-12 DIAGNOSIS — R41840 Attention and concentration deficit: Secondary | ICD-10-CM

## 2024-06-12 DIAGNOSIS — F0630 Mood disorder due to known physiological condition, unspecified: Secondary | ICD-10-CM | POA: Diagnosis not present

## 2024-06-12 MED ORDER — DEXMETHYLPHENIDATE HCL 2.5 MG PO TABS: 2.5000 mg | ORAL_TABLET | Freq: Every day | ORAL | 0 refills | Status: AC

## 2024-06-12 MED ORDER — DEXMETHYLPHENIDATE HCL ER 10 MG PO CP24: 10.0000 mg | ORAL_CAPSULE | ORAL | 0 refills | Status: AC

## 2024-06-12 NOTE — Patient Instructions (Addendum)
 Continue rexulti 0.5 mg daily  Continue Focalin  10 mg XR daily, 2.5 mg in the afternoon  Next appointment- 2/2 at 8 AM

## 2024-08-05 ENCOUNTER — Other Ambulatory Visit: Payer: Self-pay | Admitting: Psychiatry

## 2024-08-05 ENCOUNTER — Telehealth: Payer: Self-pay

## 2024-08-05 MED ORDER — BREXPIPRAZOLE 0.5 MG PO TABS: 0.5000 mg | ORAL_TABLET | Freq: Every day | ORAL | 0 refills | Status: DC

## 2024-08-05 NOTE — Telephone Encounter (Signed)
 LVM

## 2024-08-05 NOTE — Telephone Encounter (Signed)
 Yes, I would like her to remain on this medication. The refill has been ordered.

## 2024-08-05 NOTE — Telephone Encounter (Signed)
 Pt left Voicemail requesting rexulti  0.5 mg daily  Pharmacy:[id number] [address], [place] - [address]  Last seen:06/12/24 Next apt:   08/11/24  Pt stated she is unsure why it is not on her active med list. Per pt she stated she was under the impression Dr.Hisada would be refilling medication.

## 2024-08-11 ENCOUNTER — Encounter: Payer: Self-pay | Admitting: Psychiatry

## 2024-08-11 ENCOUNTER — Telehealth: Admitting: Psychiatry

## 2024-08-11 DIAGNOSIS — Z79899 Other long term (current) drug therapy: Secondary | ICD-10-CM | POA: Diagnosis not present

## 2024-08-11 DIAGNOSIS — R41840 Attention and concentration deficit: Secondary | ICD-10-CM

## 2024-08-11 DIAGNOSIS — F0630 Mood disorder due to known physiological condition, unspecified: Secondary | ICD-10-CM

## 2024-08-11 MED ORDER — BREXPIPRAZOLE 0.5 MG PO TABS: 0.5000 mg | ORAL_TABLET | Freq: Every day | ORAL | 1 refills | Status: AC

## 2024-09-24 ENCOUNTER — Ambulatory Visit: Admitting: Psychology

## 2024-10-13 ENCOUNTER — Telehealth: Admitting: Psychiatry
